# Patient Record
Sex: Male | Born: 1965 | Race: Black or African American | Hispanic: No | Smoking: Never smoker
Health system: Southern US, Community
[De-identification: ages and names within clinical notes are randomized; demographics above are authoritative.]

## PROBLEM LIST (undated history)

## (undated) DIAGNOSIS — I10 Essential (primary) hypertension: Secondary | ICD-10-CM

## (undated) DIAGNOSIS — Z86010 Personal history of colonic polyps: Secondary | ICD-10-CM

## (undated) DIAGNOSIS — E785 Hyperlipidemia, unspecified: Secondary | ICD-10-CM

## (undated) HISTORY — PX: COLONOSCOPY: SHX174

## (undated) HISTORY — PX: ROTATOR CUFF REPAIR: SHX139

## (undated) HISTORY — DX: Essential (primary) hypertension: I10

## (undated) HISTORY — DX: Personal history of colonic polyps: Z86.010

## (undated) HISTORY — DX: Hyperlipidemia, unspecified: E78.5

---

## 1997-09-22 ENCOUNTER — Ambulatory Visit (HOSPITAL_COMMUNITY): Admission: RE | Admit: 1997-09-22 | Discharge: 1997-09-22 | Payer: Self-pay | Admitting: Family Medicine

## 1999-08-25 ENCOUNTER — Emergency Department (HOSPITAL_COMMUNITY): Admission: EM | Admit: 1999-08-25 | Discharge: 1999-08-25 | Payer: Self-pay | Admitting: Emergency Medicine

## 1999-08-25 ENCOUNTER — Encounter: Payer: Self-pay | Admitting: Emergency Medicine

## 2000-05-22 ENCOUNTER — Encounter (INDEPENDENT_AMBULATORY_CARE_PROVIDER_SITE_OTHER): Payer: Self-pay | Admitting: Specialist

## 2000-05-22 ENCOUNTER — Ambulatory Visit (HOSPITAL_COMMUNITY): Admission: RE | Admit: 2000-05-22 | Discharge: 2000-05-22 | Payer: Self-pay | Admitting: General Surgery

## 2001-12-06 ENCOUNTER — Emergency Department (HOSPITAL_COMMUNITY): Admission: EM | Admit: 2001-12-06 | Discharge: 2001-12-06 | Payer: Self-pay | Admitting: Emergency Medicine

## 2001-12-15 ENCOUNTER — Ambulatory Visit (HOSPITAL_COMMUNITY): Admission: RE | Admit: 2001-12-15 | Discharge: 2001-12-15 | Payer: Self-pay | Admitting: Neurology

## 2002-02-25 ENCOUNTER — Emergency Department (HOSPITAL_COMMUNITY): Admission: EM | Admit: 2002-02-25 | Discharge: 2002-02-25 | Payer: Self-pay | Admitting: Emergency Medicine

## 2004-04-04 ENCOUNTER — Ambulatory Visit (HOSPITAL_COMMUNITY): Admission: RE | Admit: 2004-04-04 | Discharge: 2004-04-04 | Payer: Self-pay | Admitting: Internal Medicine

## 2004-06-09 ENCOUNTER — Emergency Department (HOSPITAL_COMMUNITY): Admission: EM | Admit: 2004-06-09 | Discharge: 2004-06-09 | Payer: Self-pay | Admitting: Emergency Medicine

## 2005-05-28 ENCOUNTER — Emergency Department (HOSPITAL_COMMUNITY): Admission: EM | Admit: 2005-05-28 | Discharge: 2005-05-28 | Payer: Self-pay | Admitting: Emergency Medicine

## 2005-07-23 ENCOUNTER — Ambulatory Visit (HOSPITAL_COMMUNITY): Admission: RE | Admit: 2005-07-23 | Discharge: 2005-07-23 | Payer: Self-pay | Admitting: Internal Medicine

## 2005-07-31 ENCOUNTER — Ambulatory Visit: Payer: Self-pay | Admitting: Internal Medicine

## 2005-09-04 ENCOUNTER — Ambulatory Visit: Payer: Self-pay | Admitting: Internal Medicine

## 2006-10-06 ENCOUNTER — Emergency Department (HOSPITAL_COMMUNITY): Admission: EM | Admit: 2006-10-06 | Discharge: 2006-10-06 | Payer: Self-pay | Admitting: Emergency Medicine

## 2007-07-14 ENCOUNTER — Encounter (INDEPENDENT_AMBULATORY_CARE_PROVIDER_SITE_OTHER): Payer: Self-pay | Admitting: Internal Medicine

## 2007-07-14 ENCOUNTER — Ambulatory Visit: Admission: RE | Admit: 2007-07-14 | Discharge: 2007-07-14 | Payer: Self-pay | Admitting: Internal Medicine

## 2007-07-14 ENCOUNTER — Ambulatory Visit: Payer: Self-pay | Admitting: Vascular Surgery

## 2008-12-09 ENCOUNTER — Encounter: Admission: RE | Admit: 2008-12-09 | Discharge: 2008-12-09 | Payer: Self-pay | Admitting: Internal Medicine

## 2009-10-02 ENCOUNTER — Ambulatory Visit (HOSPITAL_BASED_OUTPATIENT_CLINIC_OR_DEPARTMENT_OTHER): Admission: RE | Admit: 2009-10-02 | Discharge: 2009-10-02 | Payer: Self-pay | Admitting: Orthopaedic Surgery

## 2010-04-08 ENCOUNTER — Encounter: Payer: Self-pay | Admitting: Cardiology

## 2010-04-10 DIAGNOSIS — T7840XA Allergy, unspecified, initial encounter: Secondary | ICD-10-CM | POA: Insufficient documentation

## 2010-04-10 DIAGNOSIS — R079 Chest pain, unspecified: Secondary | ICD-10-CM | POA: Insufficient documentation

## 2010-04-10 DIAGNOSIS — E785 Hyperlipidemia, unspecified: Secondary | ICD-10-CM | POA: Insufficient documentation

## 2010-04-10 DIAGNOSIS — E782 Mixed hyperlipidemia: Secondary | ICD-10-CM | POA: Insufficient documentation

## 2010-04-10 DIAGNOSIS — I10 Essential (primary) hypertension: Secondary | ICD-10-CM | POA: Insufficient documentation

## 2010-04-11 ENCOUNTER — Institutional Professional Consult (permissible substitution) (INDEPENDENT_AMBULATORY_CARE_PROVIDER_SITE_OTHER): Payer: 59 | Admitting: Cardiology

## 2010-04-11 ENCOUNTER — Encounter: Payer: Self-pay | Admitting: Cardiology

## 2010-04-11 DIAGNOSIS — R072 Precordial pain: Secondary | ICD-10-CM

## 2010-04-18 NOTE — Assessment & Plan Note (Signed)
Summary: [facility]   History of Present Illness: 45 year old male for evaluation of chest pain. No prior cardiac history. Patient has some dyspnea with more extreme activities but not with routine activities. There is no orthopnea, PND, pedal edema, palpitations, syncope or exertional chest pain. Approximately 5 days ago while singing in church he felt a gripping sensation in his left chest for 3 minutes. It did not radiate. It was not pleuritic or positional. There were no associated symptoms. It resolved spontaneously. He had a second episode shortly thereafter. He's had no chest pain since then. Because of the above we were asked to further evaluate.  Preventive Screening-Counseling & Management  Alcohol-Tobacco     Smoking Status: never  Current Medications (verified): 1)  Crestor 10 Mg Tabs (Rosuvastatin Calcium) .... Take One Tablet By Mouth Daily. 2)  Tribenzor 40-10-25 Mg Tabs (Olmesartan-Amlodipine-Hctz) .... Once Daily  Allergies (verified): No Known Drug Allergies  Past History:  Past Medical History: HYPERLIPIDEMIA  HYPERTENSION  Past Surgical History: Right shoulder arthroscopic Surgery for fractured mandible     Family History: Reviewed history and no changes required. Hypertension No premature CAD  Social History: Reviewed history and no changes required. Full Time Married  Tobacco Use - No.  Alcohol Use - no Smoking Status:  never  Review of Systems       no fevers or chills, productive cough, hemoptysis, dysphasia, odynophagia, melena, hematochezia, dysuria, hematuria, rash, seizure activity, orthopnea, PND, pedal edema, claudication. Remaining systems are negative.   Vital Signs:  Patient profile:   46 year old male Height:      77 inches Weight:      313 pounds BMI:     37.25 Pulse rate:   89 / minute BP sitting:   122 / 80  (right arm)  Vitals Entered By: Laurance Flatten CMA (April 11, 2010 1:20 PM)  Physical Exam  General:  Well  developed/well nourished in NAD Skin warm/dry Patient not depressed No peripheral clubbing Back-normal HEENT-normal/normal eyelids Neck supple/normal carotid upstroke bilaterally; no bruits; no JVD; no thyromegaly chest - CTA/ normal expansion CV - RRR/normal S1 and S2; no murmurs, rubs or gallops;  PMI nondisplaced Abdomen -NT/ND, no HSM, no mass, + bowel sounds, no bruit 2+ femoral pulses, no bruits Ext-no edema, chords, 2+ DP Neuro-grossly nonfocal     EKG  Procedure date:  04/11/2010  Findings:      Normal sinus rhythm at a rate of 89. No ST changes.  Impression & Recommendations:  Problem # 1:  CHEST PAIN (ICD-786.50) Symptoms atypical. Schedule exercise treadmill. If negative no further workup.   Orders: Treadmill (Treadmill)  Problem # 2:  HYPERLIPIDEMIA (ICD-272.4) Continue present medications. Management per primary care. His updated medication list for this problem includes:    Crestor 10 Mg Tabs (Rosuvastatin calcium) .Marland Kitchen... Take one tablet by mouth daily.  Problem # 3:  HYPERTENSION (ICD-401.9) Blood pressure controlled on present medications. Will continue. His updated medication list for this problem includes:    Tribenzor 40-10-25 Mg Tabs (Olmesartan-amlodipine-hctz) ..... Once daily  Patient Instructions: 1)  Your physician has requested that you have an exercise tolerance test.  For further information please visit [email or web address]  Please also follow instruction sheet, as given.

## 2010-04-24 ENCOUNTER — Encounter: Payer: Self-pay | Admitting: Physician Assistant

## 2010-04-24 ENCOUNTER — Encounter (INDEPENDENT_AMBULATORY_CARE_PROVIDER_SITE_OTHER): Payer: 59 | Admitting: Physician Assistant

## 2010-04-24 DIAGNOSIS — R079 Chest pain, unspecified: Secondary | ICD-10-CM

## 2010-04-26 LAB — BASIC METABOLIC PANEL
BUN: 14 mg/dL (ref 6–23)
CO2: 29 mEq/L (ref 19–32)
Calcium: 8.9 mg/dL (ref 8.4–10.5)
Chloride: 108 mEq/L (ref 96–112)
Creatinine, Ser: 1.22 mg/dL (ref 0.4–1.5)
GFR calc Af Amer: >60 mL/min (ref >60)
GFR calc non Af Amer: >60 mL/min (ref >60)
Glucose, Bld: 118 mg/dL — ABNORMAL HIGH (ref 70–99)
Potassium: 3.8 mEq/L (ref 3.5–5.1)
Sodium: 141 mEq/L (ref 135–145)

## 2010-04-26 LAB — POCT HEMOGLOBIN-HEMACUE: Hemoglobin: 16.6 g/dL (ref 13.0–17.0)

## 2010-05-09 NOTE — Letter (Signed)
Summary: [facility] Office Visit Note   [facility] Office Visit Note   Imported By: Roderic Ovens 05/01/2010 14:36:38  _____________________________________________________________________  External Attachment:    Type:   Image     Comment:   External Document

## 2010-05-21 ENCOUNTER — Ambulatory Visit (HOSPITAL_COMMUNITY)
Admission: RE | Admit: 2010-05-21 | Discharge: 2010-05-21 | Disposition: A | Discharge status: Home / Self Care | Payer: Self-pay | Source: Ambulatory Visit | Attending: Internal Medicine | Admitting: Internal Medicine

## 2010-05-21 ENCOUNTER — Other Ambulatory Visit (HOSPITAL_COMMUNITY): Payer: Self-pay | Admitting: Internal Medicine

## 2010-05-21 DIAGNOSIS — M2010 Hallux valgus (acquired), unspecified foot: Secondary | ICD-10-CM | POA: Insufficient documentation

## 2010-05-21 DIAGNOSIS — R209 Unspecified disturbances of skin sensation: Secondary | ICD-10-CM | POA: Insufficient documentation

## 2010-05-21 DIAGNOSIS — M79609 Pain in unspecified limb: Secondary | ICD-10-CM

## 2010-06-28 NOTE — Op Note (Signed)
[facility]  Patient:    DARVELL, MONTEFORTE                MRN: [id number] Proc. Date: 05/22/00 Adm. Date:  [id number] Attending:  Carson Myrtle                           Operative Report  PREOPERATIVE DIAGNOSIS:  Fistula-in-ano.  POSTOPERATIVE DIAGNOSIS:  Fistula-in-ano.  PROCEDURE:  Repair of fistula.  SURGEON:  Kendrick Ranch, M.D.  ANESTHESIA:  General.  HISTORY OF PRESENT ILLNESS:  Mr. 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 The surgery has been carefully explained, prep given.  DESCRIPTION OF PROCEDURE:  The patient was brought to the operating room, placed supine, general LMA anesthesia provided. The patient was placed in lithotomy, perianal inspected, prepped and draped in the usual fashion. 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 The anus was gently dilated, the operating anoscope introduced, a fistula probe passed through the fistula tract. It was indeed superficial, it spared the sphincters. It was excised and debrided. Its edges and floor cauterized. Sphincters left intact of course. No other pathology noted, bleeding controlled. Gelfoam gauze and dry sterile dressing applied. He tolerated it well and was removed to the recovery room in good condition.  Written and verbal instructions were given to the patient and his girlfriend including Percocet #[id number] and he will be seen in the office. DD:  05/22/00 TD:  05/22/00 Job: [id number]

## 2010-07-15 ENCOUNTER — Telehealth: Payer: Self-pay | Admitting: Cardiology

## 2010-07-15 NOTE — Telephone Encounter (Signed)
Stress faxed to [facility] @ [phone]  07/15/10/km

## 2010-10-29 ENCOUNTER — Encounter (HOSPITAL_COMMUNITY): Payer: Self-pay

## 2010-10-29 ENCOUNTER — Emergency Department (HOSPITAL_COMMUNITY): Payer: Self-pay

## 2010-10-29 ENCOUNTER — Emergency Department (HOSPITAL_COMMUNITY)
Admission: EM | Admit: 2010-10-29 | Discharge: 2010-10-29 | Disposition: A | Discharge status: Home / Self Care | Payer: Self-pay | Attending: Emergency Medicine | Admitting: Emergency Medicine

## 2010-10-29 DIAGNOSIS — IMO0002 Reserved for concepts with insufficient information to code with codable children: Secondary | ICD-10-CM | POA: Insufficient documentation

## 2010-10-29 DIAGNOSIS — E785 Hyperlipidemia, unspecified: Secondary | ICD-10-CM | POA: Insufficient documentation

## 2010-10-29 DIAGNOSIS — I10 Essential (primary) hypertension: Secondary | ICD-10-CM | POA: Insufficient documentation

## 2010-10-29 DIAGNOSIS — Y92838 Other recreation area as the place of occurrence of the external cause: Secondary | ICD-10-CM | POA: Insufficient documentation

## 2010-10-29 DIAGNOSIS — Y9367 Activity, basketball: Secondary | ICD-10-CM | POA: Insufficient documentation

## 2010-10-29 DIAGNOSIS — Y9239 Other specified sports and athletic area as the place of occurrence of the external cause: Secondary | ICD-10-CM | POA: Insufficient documentation

## 2010-10-29 DIAGNOSIS — W219XXA Striking against or struck by unspecified sports equipment, initial encounter: Secondary | ICD-10-CM | POA: Insufficient documentation

## 2010-10-29 DIAGNOSIS — M1710 Unilateral primary osteoarthritis, unspecified knee: Secondary | ICD-10-CM | POA: Insufficient documentation

## 2010-11-27 ENCOUNTER — Other Ambulatory Visit: Payer: Self-pay | Admitting: Internal Medicine

## 2010-11-27 DIAGNOSIS — M25561 Pain in right knee: Secondary | ICD-10-CM

## 2010-12-03 ENCOUNTER — Ambulatory Visit
Admission: RE | Admit: 2010-12-03 | Discharge: 2010-12-03 | Disposition: A | Discharge status: Home / Self Care | Payer: PRIVATE HEALTH INSURANCE | Source: Ambulatory Visit | Attending: Internal Medicine | Admitting: Internal Medicine

## 2010-12-03 DIAGNOSIS — M25561 Pain in right knee: Secondary | ICD-10-CM

## 2011-09-07 IMAGING — CR DG FOOT COMPLETE 3+V*R*
3 series · 3 of 3 positions shown · non-contrast
Comparison: None.

CLINICAL DATA: Pain and numbness medially

RIGHT FOOT COMPLETE - 3+ VIEW

[view not recorded (1 of 3)]
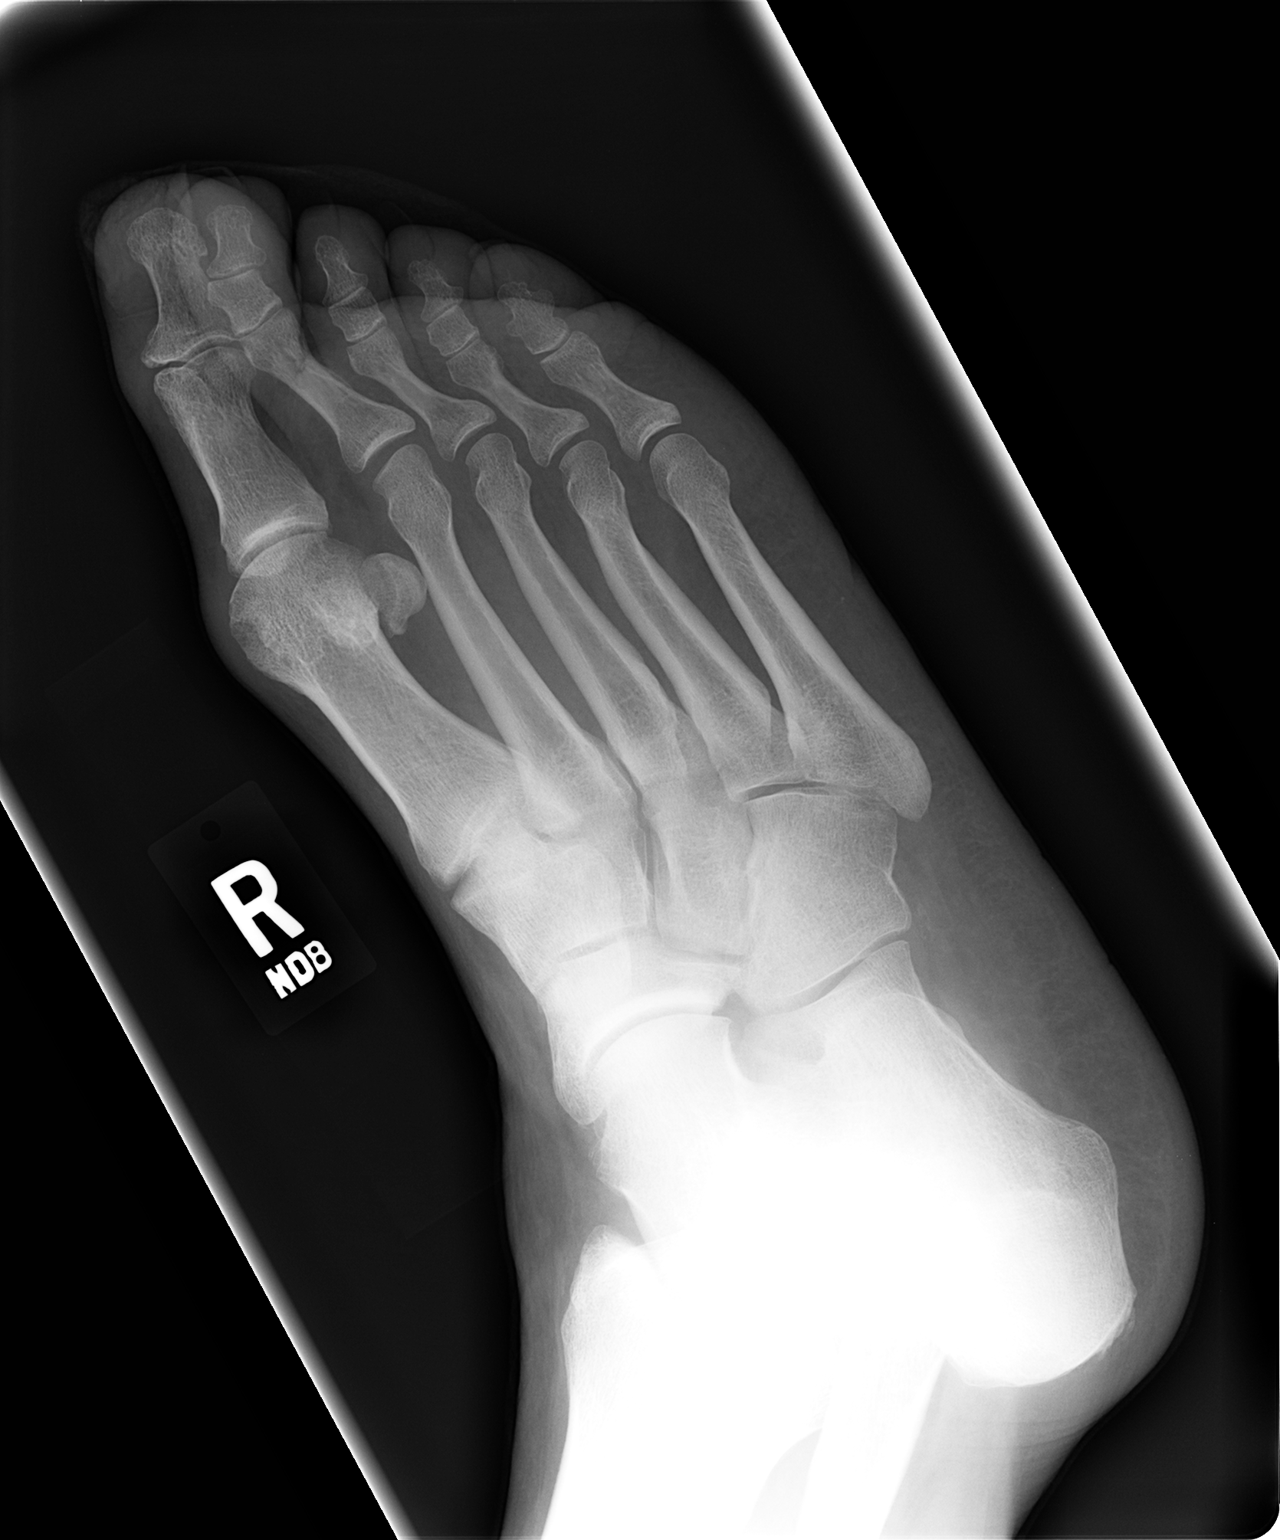

[view not recorded (2 of 3)]
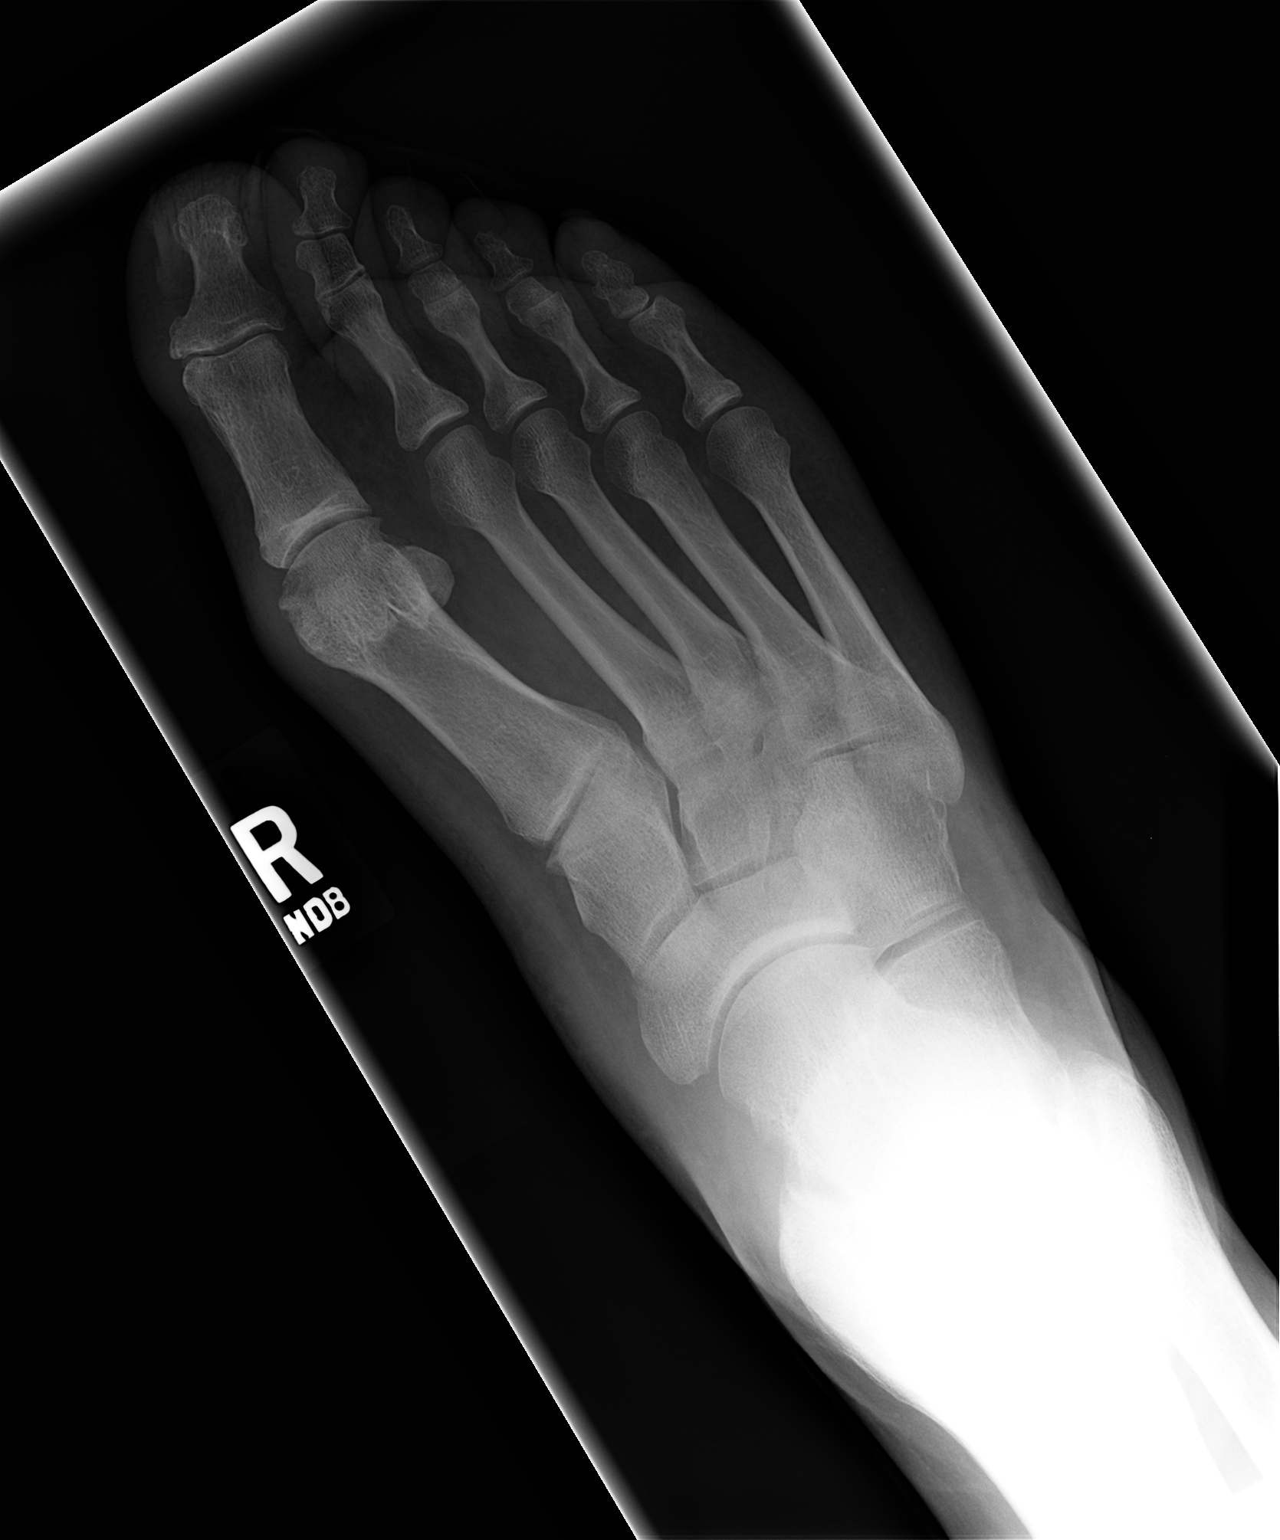

[view not recorded (3 of 3)]
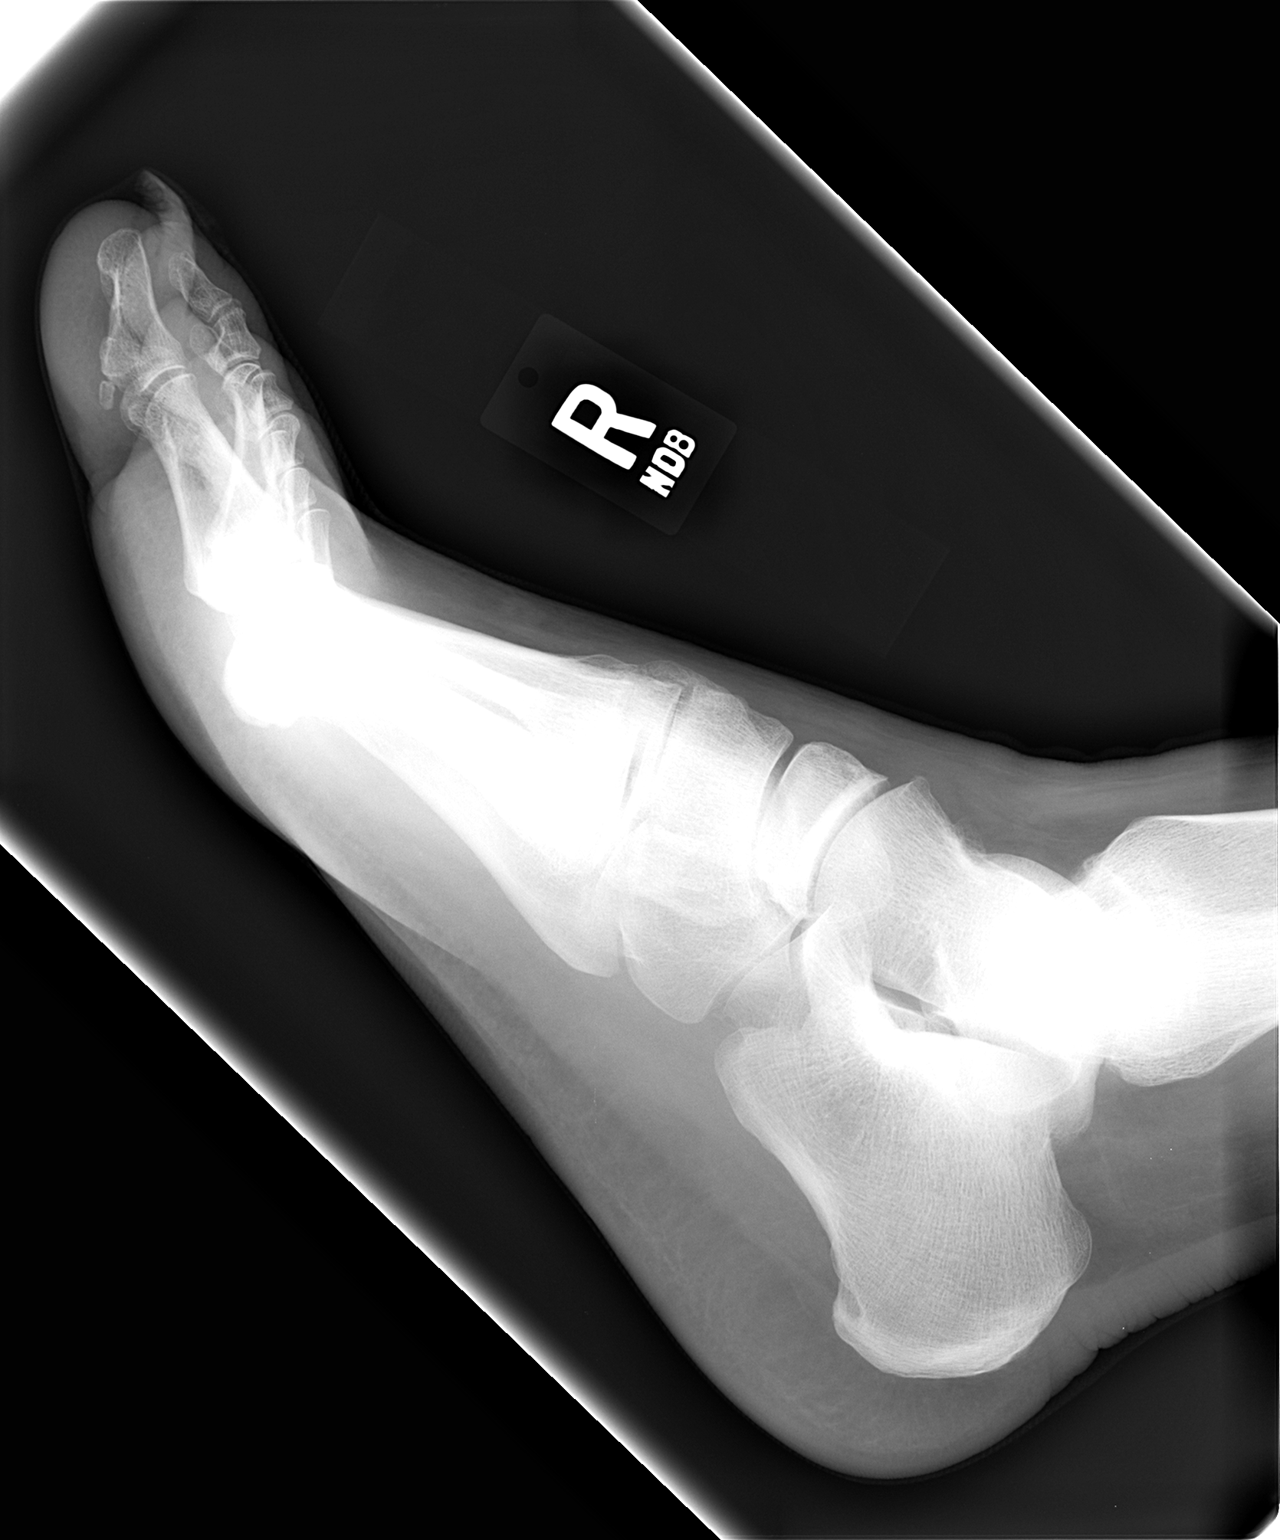

[3 of 3 positions shown; findings below may reference images not displayed]

FINDINGS: There is mild hallus valgus deformity  and subchondral
sclerosis at the first MTP joint.  There is no fracture or
dislocation.  No periosteal reaction or cortical destruction are
identified.
IMPRESSION: Mild hallux valgus deformity and subchondral sclerosis at the first
MTP joint.

## 2012-08-27 ENCOUNTER — Emergency Department (HOSPITAL_COMMUNITY)
Admission: EM | Admit: 2012-08-27 | Discharge: 2012-08-27 | Disposition: A | Discharge status: Home / Self Care | Payer: Self-pay | Attending: Emergency Medicine | Admitting: Emergency Medicine

## 2012-08-27 ENCOUNTER — Encounter (HOSPITAL_COMMUNITY): Payer: Self-pay | Admitting: Emergency Medicine

## 2012-08-27 DIAGNOSIS — M25519 Pain in unspecified shoulder: Secondary | ICD-10-CM | POA: Insufficient documentation

## 2012-08-27 DIAGNOSIS — R209 Unspecified disturbances of skin sensation: Secondary | ICD-10-CM | POA: Insufficient documentation

## 2012-08-27 DIAGNOSIS — M5412 Radiculopathy, cervical region: Secondary | ICD-10-CM | POA: Insufficient documentation

## 2012-08-27 MED ORDER — HYDROCODONE-ACETAMINOPHEN 5-325 MG PO TABS: 1.0000 | ORAL_TABLET | ORAL | Status: DC | PRN

## 2012-08-27 MED ORDER — METHOCARBAMOL 500 MG PO TABS: 500.0000 mg | ORAL_TABLET | Freq: Two times a day (BID) | ORAL | Status: DC

## 2012-08-27 MED ORDER — PREDNISONE 10 MG PO TABS: 10.0000 mg | ORAL_TABLET | Freq: Every day | ORAL | Status: DC

## 2012-08-27 NOTE — ED Notes (Addendum)
C/o L upper back pain and tingling down L arm x 1 1/2 weeks that is worse with movement.  Pain worse when trying to lay on back and sleep.  Denies chest pain.  Denies sob. No known injury.

## 2012-08-27 NOTE — ED Provider Notes (Signed)
   History    CSN: [id number] Arrival date & time 08/27/12  2126  First MD Initiated Contact with Patient 08/27/12 2211     Chief Complaint  Patient presents with  . Back Pain   HPI  History provided by the patient. The patient is a 47 year old male with previous history of rotator cuff repair presenting with complaints of pain in his left back and shoulder area radiating down his left arm to fingers. Symptoms have been persistent and slightly worsening for the past 2 weeks. Over the past few days patient has had increased tingling to his fingertips primarily his index and middle finger. His symptoms are worse when he moves his head and neck certain positions. Patient has taken Tylenol throughout the day without any significant change in his symptoms. He has not had any new injuries or trauma. Denies any repetitive activities or strenuous activity. He denies any other aggravating or alleviating factors. Denies any other associated symptoms. Denies any chest pain, palpitations shortness of breath. Denies any headache. No fever, chills or sweats.    History reviewed. No pertinent past medical history. Past Surgical History  Procedure Laterality Date  . Rotator cuff repair     No family history on file. History  Substance Use Topics  . Smoking status: Never Smoker   . Smokeless tobacco: Not on file  . Alcohol Use: No    Review of Systems  Constitutional: Negative for fever, chills, diaphoresis and unexpected weight change.  HENT: Positive for neck pain.   Respiratory: Negative for shortness of breath.   Cardiovascular: Negative for chest pain.  Musculoskeletal: Negative for back pain.  All other systems reviewed and are negative.    Allergies  Review of patient's allergies indicates no known allergies.  Home Medications  No current outpatient prescriptions on file. BP 133/73  Pulse 68  Temp(Src) 99 F (37.2 C) (Oral)  Resp 20  SpO2 100% Physical Exam  Nursing note and  vitals reviewed. Constitutional: He is oriented to person, place, and time. He appears well-developed and well-nourished.  HENT:  Head: Normocephalic.  Neck: Normal range of motion. Neck supple. Muscular tenderness present. No spinous process tenderness present.    Tenderness along the left trapezius area. No deformities. No mass. No spasm. No significant tenderness over the cervical spine. No deformity.  Cardiovascular: Normal rate and regular rhythm.   Normal distal pulses  Pulmonary/Chest: Effort normal and breath sounds normal. No stridor. No respiratory distress. He has no wheezes. He has no rales.  Abdominal: Soft.  Musculoskeletal: Normal range of motion. He exhibits no edema.  Mild tenderness around the deltoid area. No deformity or swelling. Negative Tinel's and Phalen's test  Neurological: He is alert and oriented to person, place, and time.  Strength is equal bilaterally in upper extremities. Patient reports no smoking numbness at this time with normal sensations to light and sharp touch the fingers.  Skin: Skin is warm.  Psychiatric: He has a normal mood and affect. His behavior is normal.    ED Course  Procedures     1. Cervical radicular pain     MDM  Patient seen and evaluated. Patient appears well no acute distress. No concerning or red flag symptoms for pain and tingling.  Symptoms seem consistent with the radicular or peripheral nerve pain. At this time will treat conservatively and have patient followup with PCP for further recommendations of testing. Patient agrees with plan.  Angus Seller, PA-C 08/28/12 [phone]

## 2012-08-28 NOTE — ED Provider Notes (Signed)
Medical screening examination/treatment/procedure(s) were performed by non-physician practitioner and as supervising physician I was immediately available for consultation/collaboration.    Tikia Skilton L Reily Ilic, MD 08/28/12 1520 

## 2012-09-02 ENCOUNTER — Emergency Department (HOSPITAL_COMMUNITY)
Admission: EM | Admit: 2012-09-02 | Discharge: 2012-09-02 | Disposition: A | Discharge status: Home / Self Care | Payer: Self-pay | Attending: Emergency Medicine | Admitting: Emergency Medicine

## 2012-09-02 ENCOUNTER — Emergency Department (HOSPITAL_COMMUNITY): Payer: Self-pay

## 2012-09-02 ENCOUNTER — Encounter (HOSPITAL_COMMUNITY): Payer: Self-pay | Admitting: *Deleted

## 2012-09-02 DIAGNOSIS — M5412 Radiculopathy, cervical region: Secondary | ICD-10-CM | POA: Insufficient documentation

## 2012-09-02 DIAGNOSIS — M25519 Pain in unspecified shoulder: Secondary | ICD-10-CM | POA: Insufficient documentation

## 2012-09-02 DIAGNOSIS — R209 Unspecified disturbances of skin sensation: Secondary | ICD-10-CM | POA: Insufficient documentation

## 2012-09-02 MED ORDER — HYDROMORPHONE HCL PF 1 MG/ML IJ SOLN
1.0000 mg | Freq: Once | INTRAMUSCULAR | Status: AC
  Administered 2012-09-02: 1 mg via INTRAMUSCULAR
  Filled 2012-09-02: qty 1

## 2012-09-02 MED ORDER — ONDANSETRON 4 MG PO TBDP
4.0000 mg | ORAL_TABLET | Freq: Once | ORAL | Status: AC
  Administered 2012-09-02: 4 mg via ORAL
  Filled 2012-09-02: qty 1

## 2012-09-02 MED ORDER — MORPHINE SULFATE 4 MG/ML IJ SOLN
4.0000 mg | Freq: Once | INTRAMUSCULAR | Status: AC
  Administered 2012-09-02: 4 mg via INTRAVENOUS

## 2012-09-02 MED ORDER — MORPHINE SULFATE 4 MG/ML IJ SOLN
INTRAMUSCULAR | Status: AC
  Filled 2012-09-02: qty 1

## 2012-09-02 NOTE — ED Provider Notes (Signed)
History    CSN: [id number] Arrival date & time 09/02/12  1101  First MD Initiated Contact with Patient 09/02/12 1125     No chief complaint on file.  (Consider location/radiation/quality/duration/timing/severity/associated sxs/prior Treatment) HPI Pt presenting with c/o left sided shoulder and neck pain.  Pt has had similar symptoms over the past 2 weeks.  He states he also has some numbness and tingling in his 2nd and 3rd fingers on the left hand intermittently.  No fever, no weakness of arm or hand.  He has had no injury.  Pain began when awakening from sleep one morning.  He has been taking motrin and prednisone (was seen in ED approx 1 week ago for similar symptoms)- he states he could not afford to get pain meds and muscle relaxer filled.  He does not feel the meds he is taking are helping him very much.  No chest pain or shortness of breath.  There are no other associated systemic symptoms, there are no other alleviating or modifying factors.  History reviewed. No pertinent past medical history. Past Surgical History  Procedure Laterality Date  . Rotator cuff repair     No family history on file. History  Substance Use Topics  . Smoking status: Never Smoker   . Smokeless tobacco: Not on file  . Alcohol Use: No    Review of Systems ROS reviewed and all otherwise negative except for mentioned in HPI  Allergies  Review of patient's allergies indicates no known allergies.  Home Medications   Current Outpatient Rx  Name  Route  Sig  Dispense  Refill  . ibuprofen (ADVIL,MOTRIN) 200 MG tablet   Oral   Take 400 mg by mouth every 6 (six) hours as needed for pain.         Marland Kitchen ibuprofen (ADVIL,MOTRIN) 800 MG tablet   Oral   Take 800 mg by mouth every 8 (eight) hours as needed for pain.         . Menthol, Topical Analgesic, (ICY HOT) 16 % LIQD   Apply externally   Apply 1 application topically as needed (pain).         . predniSONE (DELTASONE) 10 MG tablet   Oral  Take 1 tablet (10 mg total) by mouth daily. Take 6 tabs on day 1, 5 tabs on day 2, 4 tabs on day 3, 3 tabs on day 4, 2 tabs on day 5, and 1 tab on day 6.   21 tablet   0    BP 148/99  Pulse 72  Temp(Src) 99.1 F (37.3 C) (Oral)  Resp 20  SpO2 99% Vitals reviewed Physical Exam Physical Examination: General appearance - alert, well appearing, and in no distress Mental status - alert, oriented to person, place, and time Eyes - no conjunctival injection, no scleral icterus Neck - supple, pain in left trapezoid region with palpation and with movement of neck, no midline cspine tenderness Chest - clear to auscultation, no wheezes, rales or rhonchi, symmetric air entry Heart - normal rate, regular rhythm, normal S1, S2, no murmurs, rubs, clicks or gallops Back exam - full range of motion, no midline tenderness of back, no CVA tenderness Neurological - alert, oriented, normal speech, strength 5/5 in extremities x 4, full grip strength in left hand, normal testing of median/radial/ulnar nerve function, sensation intact to light touch Musculoskeletal - no joint tenderness, deformity or swelling Extremities - peripheral pulses normal, no pedal edema, no clubbing or cyanosis Skin - normal coloration and turgor,  no rashes, brisk cap refill  ED Course  Procedures (including critical care time)    Date: 09/02/2012  Rate: 72  Rhythm: normal sinus rhythm  QRS Axis: normal  Intervals: normal  ST/T Wave abnormalities: normal  Conduction Disutrbances: none  Narrative Interpretation: unremarkable, no significant change from last ekg of 08/27/12     Labs Reviewed - No data to display Dg Cervical Spine 2-3 Views  09/02/2012   *RADIOLOGY REPORT*  Clinical Data: Left-sided neck pain  CERVICAL SPINE - 2-3 VIEW  Comparison: None.  Findings: Seven cervical segments are well visualized.  Vertebral body height is well-maintained.  The odontoid is within normal limits.  Mild osteophytic changes are seen  particularly at the C2-3 level and C5-6 level.  No soft tissue changes are noted. Postsurgical changes in the mandible are noted.  IMPRESSION: Mild degenerative change without acute abnormality.   Original Report Authenticated By: Alcide Clever, M.D.   1. Cervical radiculopathy     MDM  Pt presenting with c/o pain in left side of neck and upper shoulder with radiation to left arm/hand, intermittent tingling of fingers- neurologic exam normal.  Pt has been taking nsaids and steroids, pt treated with pain meds in the ED.  Advised taking pain meds and muscle relaxers as well.  He will need to arrange follow up with his PMD, also given information for neurosurgery.  Discharged with strict return precautions.  Pt agreeable with plan.  Ethelda Chick, MD 09/02/12 [phone]

## 2012-09-02 NOTE — ED Notes (Signed)
Pt reports sharp left sided shoulder pain, started 2 weeks ago. Numbness/tingling to left fingers. Pain has gotten progressively worse. Denies any injury. Diaphoresis noted. Pt denies n/v, sob or dizziness. Has tried taking motrin, has not experienced any relief

## 2012-09-02 NOTE — Progress Notes (Signed)
[facility] provided pt with [employer] and a list of primary care resources.

## 2013-05-06 ENCOUNTER — Encounter: Payer: Self-pay | Admitting: Physician Assistant

## 2013-05-20 ENCOUNTER — Emergency Department (HOSPITAL_COMMUNITY): Payer: BC Managed Care – PPO

## 2013-05-20 ENCOUNTER — Emergency Department (HOSPITAL_COMMUNITY)
Admission: EM | Admit: 2013-05-20 | Discharge: 2013-05-20 | Disposition: A | Discharge status: Home / Self Care | Payer: BC Managed Care – PPO | Attending: Emergency Medicine | Admitting: Emergency Medicine

## 2013-05-20 DIAGNOSIS — M25519 Pain in unspecified shoulder: Secondary | ICD-10-CM | POA: Insufficient documentation

## 2013-05-20 DIAGNOSIS — R079 Chest pain, unspecified: Secondary | ICD-10-CM | POA: Insufficient documentation

## 2013-05-20 DIAGNOSIS — I10 Essential (primary) hypertension: Secondary | ICD-10-CM | POA: Insufficient documentation

## 2013-05-20 DIAGNOSIS — Z9861 Coronary angioplasty status: Secondary | ICD-10-CM | POA: Insufficient documentation

## 2013-05-20 DIAGNOSIS — Z79899 Other long term (current) drug therapy: Secondary | ICD-10-CM | POA: Insufficient documentation

## 2013-05-20 LAB — CBC WITH DIFFERENTIAL/PLATELET
Basophils Absolute: 0 10*3/uL (ref 0.0–0.1)
Basophils Relative: 1 % (ref 0–1)
Eosinophils Absolute: 0.1 10*3/uL (ref 0.0–0.7)
Eosinophils Relative: 2 % (ref 0–5)
HCT: 44.1 % (ref 39.0–52.0)
Hemoglobin: 14.8 g/dL (ref 13.0–17.0)
Lymphocytes Relative: 42 % (ref 12–46)
Lymphs Abs: 1.6 10*3/uL (ref 0.7–4.0)
MCH: 30.2 pg (ref 26.0–34.0)
MCHC: 33.6 g/dL (ref 30.0–36.0)
MCV: 90 fL (ref 78.0–100.0)
Monocytes Absolute: 0.4 10*3/uL (ref 0.1–1.0)
Monocytes Relative: 11 % (ref 3–12)
Neutro Abs: 1.7 10*3/uL (ref 1.7–7.7)
Neutrophils Relative %: 45 % (ref 43–77)
Platelets: 166 10*3/uL (ref 150–400)
RBC: 4.9 MIL/uL (ref 4.22–5.81)
RDW: 13.4 % (ref 11.5–15.5)
WBC: 3.7 10*3/uL — ABNORMAL LOW (ref 4.0–10.5)

## 2013-05-20 LAB — I-STAT TROPONIN, ED
Troponin i, poc: 0 ng/mL (ref 0.00–0.08)
Troponin i, poc: 0 ng/mL (ref 0.00–0.08)

## 2013-05-20 LAB — BASIC METABOLIC PANEL
BUN: 14 mg/dL (ref 6–23)
CO2: 25 mEq/L (ref 19–32)
Calcium: 8.5 mg/dL (ref 8.4–10.5)
Chloride: 108 mEq/L (ref 96–112)
Creatinine, Ser: 1.24 mg/dL (ref 0.50–1.35)
GFR calc Af Amer: 77 mL/min — ABNORMAL LOW (ref >90)
GFR calc non Af Amer: 67 mL/min — ABNORMAL LOW (ref >90)
Glucose, Bld: 89 mg/dL (ref 70–99)
Potassium: 3.8 mEq/L (ref 3.7–5.3)
Sodium: 143 mEq/L (ref 137–147)

## 2013-05-20 MED ORDER — ASPIRIN 325 MG PO TABS
325.0000 mg | ORAL_TABLET | Freq: Once | ORAL | Status: AC
  Administered 2013-05-20: 325 mg via ORAL
  Filled 2013-05-20: qty 1

## 2013-05-20 MED ORDER — NITROGLYCERIN 0.4 MG SL SUBL
0.4000 mg | SUBLINGUAL_TABLET | SUBLINGUAL | Status: AC | PRN
  Administered 2013-05-20 (×3): 0.4 mg via SUBLINGUAL
  Filled 2013-05-20: qty 1

## 2013-05-20 NOTE — ED Notes (Signed)
IV insertion attempted x 2, another RN at bedside now to draw blood and insert IV

## 2013-05-20 NOTE — ED Provider Notes (Signed)
CSN: [id number]     Arrival date & time 05/20/13  1817 History   First MD Initiated Contact with Patient 05/20/13 1934     Chief Complaint  Patient presents with  . Chest Pain  . Shoulder Pain     (Consider location/radiation/quality/duration/timing/severity/associated sxs/prior Treatment) The history is provided by the patient.  Glenn Oliver is a 48 y.o. male hx of HTN, previous cardiac stent done at [facility] here with chest pain. Intermittent chest pain for the last several days. Worse with movement. Has constant pain today. Denies shortness of breath. Had cardiac stent 2 years ago with similar pain. He has similar pain a year ago and had a normal stress test. Recently changed BP meds to lisinopril and bystolic.    No past medical history on file. Past Surgical History  Procedure Laterality Date  . Rotator cuff repair     No family history on file. History  Substance Use Topics  . Smoking status: Never Smoker   . Smokeless tobacco: Not on file  . Alcohol Use: No    Review of Systems  Cardiovascular: Positive for chest pain.  All other systems reviewed and are negative.     Allergies  Review of patient's allergies indicates no known allergies.  Home Medications   Current Outpatient Rx  Name  Route  Sig  Dispense  Refill  . lisinopril (PRINIVIL,ZESTRIL) 10 MG tablet   Oral   Take 5 mg by mouth daily. Take 1/2 tablet         . nebivolol (BYSTOLIC) 10 MG tablet   Oral   Take 10 mg by mouth daily.          BP 125/86  Pulse 50  Temp(Src) 97.8 F (36.6 C) (Oral)  Resp 11  SpO2 100% Physical Exam  Nursing note and vitals reviewed. Constitutional: He is oriented to person, place, and time. He appears well-developed and well-nourished.  Slightly uncomfortable   HENT:  Head: Normocephalic.  Mouth/Throat: Oropharynx is clear and moist.  Eyes: Conjunctivae and EOM are normal. Pupils are equal, round, and reactive to light.  Neck: Normal range of motion.  Neck supple.  Cardiovascular: Normal rate, regular rhythm and normal heart sounds.   Pulmonary/Chest: Effort normal and breath sounds normal. No respiratory distress. He has no wheezes. He has no rales. He exhibits no tenderness.  Abdominal: Soft. Bowel sounds are normal. He exhibits no distension. There is no tenderness. There is no rebound and no guarding.  Musculoskeletal: Normal range of motion. He exhibits no edema and no tenderness.  Neurological: He is alert and oriented to person, place, and time. No cranial nerve deficit. Coordination normal.  Skin: Skin is warm and dry.  Psychiatric: He has a normal mood and affect. His behavior is normal. Judgment and thought content normal.    ED Course  Procedures (including critical care time) Labs Review Labs Reviewed  CBC WITH DIFFERENTIAL - Abnormal; Notable for the following:    WBC 3.7 (*)    All other components within normal limits  BASIC METABOLIC PANEL - Abnormal; Notable for the following:    GFR calc non Af Amer 67 (*)    GFR calc Af Amer 77 (*)    All other components within normal limits  I-STAT TROPOININ, ED  Randolm Idol, ED   Imaging Review Dg Chest 2 View  05/20/2013   CLINICAL DATA:  Chest and shoulder pain.  EXAM: CHEST  2 VIEW  COMPARISON:  DG CHEST 2 VIEW dated  07/23/2005  FINDINGS: Cardiomediastinal silhouette is unremarkable. The lungs are clear without pleural effusions or focal consolidations. Trachea projects midline and there is no pneumothorax. Soft tissue planes and included osseous structures are non-suspicious. Multiple EKG lines overlie the patient and may obscure subtle underlying pathology. Gas-filled nondistended small large bowel and included view of the abdomen.  IMPRESSION: No acute cardiopulmonary process.   Electronically Signed   By: Elon Alas   On: 05/20/2013 20:50     EKG Interpretation   Date/Time:  Friday May 20 2013 18:59:36 EDT Ventricular Rate:  72 PR Interval:  175 QRS  Duration: 87 QT Interval:  392 QTC Calculation: 429 R Axis:   20 Text Interpretation:  Sinus rhythm Abnormal R-wave progression, early  transition No significant change since last tracing Confirmed by YAO  MD,  DAVID ([id number]) on 05/20/2013 7:34:27 PM      MDM   Final diagnoses:  None    Glenn Oliver is a 48 y.o. male here with chest pain. Given history of stent, he is moderate risk for ACS. Will get trop x 2. Will reassess after asa, nitro.   11:29 PM Pain free. Trop neg x 2. Will d/c home. Recommend outpatient stress test.    Wandra Arthurs, MD 05/20/13 2329

## 2013-05-20 NOTE — Discharge Instructions (Signed)
Take Aspirin 81 mg daily.   Follow up with your doctor. You may need a stress test.   Return to ER if you have severe chest pain, shortness of breath.

## 2013-05-20 NOTE — ED Notes (Signed)
Pt states today she had intermit chest pain began this am while at rest states that he had recenlty been started on bp meds and they changed them a few weeks ago. Lt arm, lt shoulder and lt chest pain. Dry, intermit sob. States last yr had this same and they gave him 2 nitro for relief and has had a stress test done also.

## 2014-01-23 ENCOUNTER — Encounter: Payer: Self-pay | Admitting: Internal Medicine

## 2014-04-06 ENCOUNTER — Encounter: Payer: Self-pay | Admitting: Internal Medicine

## 2014-05-30 ENCOUNTER — Ambulatory Visit (AMBULATORY_SURGERY_CENTER): Payer: Self-pay | Admitting: *Deleted

## 2014-05-30 VITALS — Ht 76.0 in | Wt 281.4 lb

## 2014-05-30 DIAGNOSIS — Z1211 Encounter for screening for malignant neoplasm of colon: Secondary | ICD-10-CM

## 2014-06-12 DIAGNOSIS — Z86010 Personal history of colonic polyps: Secondary | ICD-10-CM

## 2014-06-12 DIAGNOSIS — Z860101 Personal history of adenomatous and serrated colon polyps: Secondary | ICD-10-CM

## 2014-06-12 HISTORY — DX: Personal history of colonic polyps: Z86.010

## 2014-06-12 HISTORY — DX: Personal history of adenomatous and serrated colon polyps: Z86.0101

## 2014-06-13 ENCOUNTER — Encounter: Payer: Self-pay | Admitting: Internal Medicine

## 2014-06-13 ENCOUNTER — Ambulatory Visit (AMBULATORY_SURGERY_CENTER): Payer: Managed Care, Other (non HMO) | Admitting: Internal Medicine

## 2014-06-13 VITALS — BP 111/79 | HR 63 | Temp 97.7°F | Resp 28 | Ht 76.0 in | Wt 281.0 lb

## 2014-06-13 DIAGNOSIS — K648 Other hemorrhoids: Secondary | ICD-10-CM

## 2014-06-13 DIAGNOSIS — Z1211 Encounter for screening for malignant neoplasm of colon: Secondary | ICD-10-CM | POA: Diagnosis present

## 2014-06-13 DIAGNOSIS — D120 Benign neoplasm of cecum: Secondary | ICD-10-CM

## 2014-06-13 MED ORDER — SODIUM CHLORIDE 0.9 % IV SOLN
500.0000 mL | INTRAVENOUS | Status: DC
Start: 2014-06-13 — End: 2014-06-15

## 2014-06-13 NOTE — Progress Notes (Signed)
A/ox3 pleased with MAC, report to Jane RN 

## 2014-06-13 NOTE — Op Note (Signed)
[place]  [address] [place], [id number]   COLONOSCOPY PROCEDURE REPORT  PATIENT: Glenn Oliver, Glenn Oliver  MR#: [id number] BIRTHDATE: 1965-03-21 , 48  yrs. old GENDER: male ENDOSCOPIST: Gatha Mayer, MD, [facility] PROCEDURE DATE:  06/13/2014 PROCEDURE:   Colonoscopy, screening and Colonoscopy with snare polypectomy First Screening Colonoscopy - Avg.  risk and is 50 yrs.  old or older Yes.  Prior Negative Screening - Now for repeat screening. N/A  History of Adenoma - Now for follow-up colonoscopy & has been > or = to 3 yrs.  N/A ASA CLASS:   Class II INDICATIONS:Screening for colonic neoplasia and Colorectal Neoplasm Risk Assessment for this procedure is average risk. MEDICATIONS: Propofol 350 mg IV and Monitored anesthesia care  DESCRIPTION OF PROCEDURE:   After the risks benefits and alternatives of the procedure were thoroughly explained, informed consent was obtained.  The digital rectal exam revealed no abnormalities of the rectum, revealed no prostatic nodules, and revealed the prostate was not enlarged.   The [id number] endoscope was introduced through the anus and advanced to the cecum, which was identified by both the appendix and ileocecal valve. No adverse events experienced.   The quality of the prep was adequate (MiraLax was used)  The instrument was then slowly withdrawn as the colon was fully examined.   COLON FINDINGS: Two sessile polyps ranging from 5 to 65mm in size were found at the cecum.  Polypectomies were performed with a cold snare.  The resection was complete, the polyp tissue was completely retrieved and sent to histology.   Internal hemorrhoids were found. The examination was otherwise normal.  Retroflexed views revealed internal hemorrhoids. The time to cecum = 7.5 Withdrawal time = 17.9   The scope was withdrawn and the procedure completed. COMPLICATIONS: There were no immediate complications.  ENDOSCOPIC IMPRESSION: 1.    Two sessile polyps ranging from 5 to 27mm in size were found at the cecum; polypectomies were performed with a cold snare 2.   Internal hemorrhoids 3.   The examination was otherwise normal - adequate prep  RECOMMENDATIONS: 1.  Timing of repeat colonoscopy will be determined by pathology findings. 2.  Hemorrhoid banding an option if hemorrhoids are symptomatic  eSigned:  Gatha Mayer, MD, [facility] 06/13/2014 12:34 PM   cc: Glendale Chard, MD and The Patient

## 2014-06-13 NOTE — Progress Notes (Signed)
Called to room to assist during endoscopic procedure.  Patient ID and intended procedure confirmed with present staff. Received instructions for my participation in the procedure from the performing physician.  

## 2014-06-13 NOTE — Patient Instructions (Addendum)
I found and removed 2 polyps that look benign.  You also have hemorrhoids.  I will let you know pathology results and when to have another routine colonoscopy by mail.  I appreciate the opportunity to care for you. Gatha Mayer, MD, FACG  YOU HAD AN ENDOSCOPIC PROCEDURE TODAY AT [facility]:   Refer to the procedure report that was given to you for any specific questions about what was found during the examination.  If the procedure report does not answer your questions, please call your gastroenterologist to clarify.  If you requested that your care partner not be given the details of your procedure findings, then the procedure report has been included in a sealed envelope for you to review at your convenience later.  YOU SHOULD EXPECT: Some feelings of bloating in the abdomen. Passage of more gas than usual.  Walking can help get rid of the air that was put into your GI tract during the procedure and reduce the bloating. If you had a lower endoscopy (such as a colonoscopy or flexible sigmoidoscopy) you may notice spotting of blood in your stool or on the toilet paper. If you underwent a bowel prep for your procedure, you may not have a normal bowel movement for a few days.  Please Note:  You might notice some irritation and congestion in your nose or some drainage.  This is from the oxygen used during your procedure.  There is no need for concern and it should clear up in a day or so.  SYMPTOMS TO REPORT IMMEDIATELY:   Following lower endoscopy (colonoscopy or flexible sigmoidoscopy):  Excessive amounts of blood in the stool  Significant tenderness or worsening of abdominal pains  Swelling of the abdomen that is new, acute  Fever of 100F or higher    For urgent or emergent issues, a gastroenterologist can be reached at any hour by calling [phone].   DIET: Your first meal following the procedure should be a small meal and then it is ok to progress to  your normal diet. Heavy or fried foods are harder to digest and may make you feel nauseous or bloated.  Likewise, meals heavy in dairy and vegetables can increase bloating.  Drink plenty of fluids but you should avoid alcoholic beverages for 24 hours.  ACTIVITY:  You should plan to take it easy for the rest of today and you should NOT DRIVE or use heavy machinery until tomorrow (because of the sedation medicines used during the test).    FOLLOW UP: Our staff will call the number listed on your records the next business day following your procedure to check on you and address any questions or concerns that you may have regarding the information given to you following your procedure. If we do not reach you, we will leave a message.  However, if you are feeling well and you are not experiencing any problems, there is no need to return our call.  We will assume that you have returned to your regular daily activities without incident.  If any biopsies were taken you will be contacted by phone or by letter within the next 1-3 weeks.  Please call us at [phone] if you have not heard about the biopsies in 3 weeks.    SIGNATURES/CONFIDENTIALITY: You and/or your care partner have signed paperwork which will be entered into your electronic medical record.  These signatures attest to the fact that that the information above on your After Visit  Summary has been reviewed and is understood.  Full responsibility of the confidentiality of this discharge information lies with you and/or your care-partner.  Polyp, hemorrhoid , and  Hemorrhoid banding information given.

## 2014-06-14 ENCOUNTER — Telehealth: Payer: Self-pay | Admitting: *Deleted

## 2014-06-14 NOTE — Telephone Encounter (Signed)
  No answer and no answering machine picked up to leave message

## 2014-06-20 ENCOUNTER — Encounter: Payer: Self-pay | Admitting: Internal Medicine

## 2014-06-20 DIAGNOSIS — Z86010 Personal history of colonic polyps: Secondary | ICD-10-CM

## 2014-06-20 NOTE — Progress Notes (Signed)
Quick Note:  2 adenomas max 10 mm Repeat colonoscopy 2019 ______

## 2014-08-26 ENCOUNTER — Encounter (HOSPITAL_COMMUNITY): Payer: Self-pay | Admitting: Emergency Medicine

## 2014-08-26 ENCOUNTER — Observation Stay (HOSPITAL_COMMUNITY)
Admission: EM | Admit: 2014-08-26 | Discharge: 2014-08-27 | Disposition: A | Discharge status: Home / Self Care | Payer: Managed Care, Other (non HMO) | Attending: Internal Medicine | Admitting: Internal Medicine

## 2014-08-26 ENCOUNTER — Emergency Department (HOSPITAL_COMMUNITY): Payer: Managed Care, Other (non HMO)

## 2014-08-26 DIAGNOSIS — R0789 Other chest pain: Secondary | ICD-10-CM | POA: Diagnosis not present

## 2014-08-26 DIAGNOSIS — I10 Essential (primary) hypertension: Secondary | ICD-10-CM | POA: Diagnosis not present

## 2014-08-26 DIAGNOSIS — R079 Chest pain, unspecified: Secondary | ICD-10-CM | POA: Diagnosis present

## 2014-08-26 DIAGNOSIS — Z79899 Other long term (current) drug therapy: Secondary | ICD-10-CM | POA: Diagnosis not present

## 2014-08-26 DIAGNOSIS — I2510 Atherosclerotic heart disease of native coronary artery without angina pectoris: Secondary | ICD-10-CM | POA: Insufficient documentation

## 2014-08-26 DIAGNOSIS — E785 Hyperlipidemia, unspecified: Secondary | ICD-10-CM | POA: Insufficient documentation

## 2014-08-26 DIAGNOSIS — E782 Mixed hyperlipidemia: Secondary | ICD-10-CM | POA: Diagnosis present

## 2014-08-26 DIAGNOSIS — R0602 Shortness of breath: Secondary | ICD-10-CM | POA: Insufficient documentation

## 2014-08-26 DIAGNOSIS — Z955 Presence of coronary angioplasty implant and graft: Secondary | ICD-10-CM | POA: Diagnosis not present

## 2014-08-26 LAB — CBC
HCT: 48.3 % (ref 39.0–52.0)
Hemoglobin: 15.7 g/dL (ref 13.0–17.0)
MCH: 30 pg (ref 26.0–34.0)
MCHC: 32.5 g/dL (ref 30.0–36.0)
MCV: 92.2 fL (ref 78.0–100.0)
Platelets: 170 10*3/uL (ref 150–400)
RBC: 5.24 MIL/uL (ref 4.22–5.81)
RDW: 13.7 % (ref 11.5–15.5)
WBC: 3.7 10*3/uL — ABNORMAL LOW (ref 4.0–10.5)

## 2014-08-26 LAB — BASIC METABOLIC PANEL
Anion gap: 5 (ref 5–15)
BUN: 15 mg/dL (ref 6–20)
CO2: 28 mmol/L (ref 22–32)
Calcium: 8.5 mg/dL — ABNORMAL LOW (ref 8.9–10.3)
Chloride: 109 mmol/L (ref 101–111)
Creatinine, Ser: 1.45 mg/dL — ABNORMAL HIGH (ref 0.61–1.24)
GFR calc Af Amer: >60 mL/min (ref >60)
GFR calc non Af Amer: 55 mL/min — ABNORMAL LOW (ref >60)
Glucose, Bld: 101 mg/dL — ABNORMAL HIGH (ref 65–99)
Potassium: 3.8 mmol/L (ref 3.5–5.1)
Sodium: 142 mmol/L (ref 135–145)

## 2014-08-26 LAB — TROPONIN I: Troponin I: <0.03 ng/mL (ref <0.031)

## 2014-08-26 MED ORDER — ONDANSETRON HCL 4 MG/2ML IJ SOLN
4.0000 mg | Freq: Once | INTRAMUSCULAR | Status: AC
  Administered 2014-08-26: 4 mg via INTRAVENOUS
  Filled 2014-08-26: qty 2

## 2014-08-26 MED ORDER — MORPHINE SULFATE 4 MG/ML IJ SOLN
4.0000 mg | Freq: Once | INTRAMUSCULAR | Status: AC
  Administered 2014-08-26: 4 mg via INTRAVENOUS
  Filled 2014-08-26: qty 1

## 2014-08-26 MED ORDER — NITROGLYCERIN 0.4 MG SL SUBL
0.4000 mg | SUBLINGUAL_TABLET | SUBLINGUAL | Status: AC | PRN
  Administered 2014-08-26 (×3): 0.4 mg via SUBLINGUAL
  Filled 2014-08-26: qty 1

## 2014-08-26 NOTE — ED Notes (Signed)
Pt reports that his mid chest pain has decreased to 6/10 and still feels like "pressure".  He now also has a nagging discomfort in his lower back and some tingling in his left arm.  PA notified.

## 2014-08-26 NOTE — ED Notes (Addendum)
Pt complaining of chest pain (pressure)/sob starting at 22:00 tonight. States he has been sweating more. Denies N/V. States this has only happened once before 1 year prior and he had had one stent placed.   Pt took 2 aspirins prior to arrival

## 2014-08-26 NOTE — ED Provider Notes (Signed)
CSN: [id number]     Arrival date & time 08/26/14  2216 History   First MD Initiated Contact with Patient 08/26/14 2222     Chief Complaint  Patient presents with  . Chest Pain  . Shortness of Breath     (Consider location/radiation/quality/duration/timing/severity/associated sxs/prior Treatment) HPI Comments: Patient is 49 year old male with a past medical history of hypertension and hyperlipidemia who presents with sudden onset of chest pain that started 20 minutes prior to arrival. Patient reports he was sitting on his couch and watching TV when he had sudden onset of central chest pressure that is severe without radiation. He reports associated diaphoresis and SOB. He continues to have chest pain. He took ASA at home. No aggravating/alleviating factors. Patient reports he had a stent placed when he was having chest pain about 1 year ago in [place].   Patient is a 49 y.o. male presenting with chest pain and shortness of breath.  Chest Pain Associated symptoms: diaphoresis and shortness of breath   Associated symptoms: no abdominal pain, no dizziness, no dysphagia, no fatigue, no fever, no nausea, no palpitations, not vomiting and no weakness   Shortness of Breath Associated symptoms: chest pain and diaphoresis   Associated symptoms: no abdominal pain, no fever, no neck pain and no vomiting     Past Medical History  Diagnosis Date  . Hypertension   . Hyperlipidemia     no meds   . Hx of adenomatous colonic polyps 06/12/2014   Past Surgical History  Procedure Laterality Date  . Rotator cuff repair      right  . Colonoscopy     Family History  Problem Relation Age of Onset  . Prostate cancer Maternal Uncle   . Colon cancer Neg Hx   . Esophageal cancer Neg Hx   . Rectal cancer Neg Hx   . Stomach cancer Neg Hx    History  Substance Use Topics  . Smoking status: Never Smoker   . Smokeless tobacco: Never Used  . Alcohol Use: No    Review of Systems  Constitutional: Positive  for diaphoresis. Negative for fever, chills and fatigue.  HENT: Negative for trouble swallowing.   Eyes: Negative for visual disturbance.  Respiratory: Positive for shortness of breath.   Cardiovascular: Positive for chest pain. Negative for palpitations.  Gastrointestinal: Negative for nausea, vomiting, abdominal pain and diarrhea.  Genitourinary: Negative for dysuria and difficulty urinating.  Musculoskeletal: Negative for arthralgias and neck pain.  Skin: Negative for color change.  Neurological: Negative for dizziness and weakness.  Psychiatric/Behavioral: Negative for dysphoric mood.      Allergies  Review of patient's allergies indicates no known allergies.  Home Medications   Prior to Admission medications   Medication Sig Start Date End Date Taking? Authorizing Provider  Cholecalciferol (VITAMIN D PO) Take by mouth. Takes twice weekly    Historical Provider, MD  lisinopril (PRINIVIL,ZESTRIL) 10 MG tablet Take 5 mg by mouth daily. Take 1/2 tablet    Historical Provider, MD   BP 158/93 mmHg  Pulse 66  Temp(Src) 98.1 F (36.7 C) (Oral)  Resp 16  SpO2 100% Physical Exam  Constitutional: He is oriented to person, place, and time. He appears well-developed and well-nourished. No distress.  Patient diaphoretic  HENT:  Head: Normocephalic and atraumatic.  Eyes: Conjunctivae and EOM are normal.  Neck: Normal range of motion.  Cardiovascular: Normal rate and regular rhythm.  Exam reveals no gallop and no friction rub.   No murmur heard.  Pulmonary/Chest: Effort normal and breath sounds normal. He has no wheezes. He has no rales. He exhibits no tenderness.  Abdominal: Soft. He exhibits no distension. There is no tenderness. There is no rebound.  Musculoskeletal: Normal range of motion.  Neurological: He is alert and oriented to person, place, and time. Coordination normal.  Speech is goal-oriented. Moves limbs without ataxia.   Skin: Skin is warm and dry.  Psychiatric: He  has a normal mood and affect. His behavior is normal.  Nursing note and vitals reviewed.   ED Course  Procedures (including critical care time) Labs Review Labs Reviewed  BASIC METABOLIC PANEL - Abnormal; Notable for the following:    Glucose, Bld 101 (*)    Creatinine, Ser 1.45 (*)    Calcium 8.5 (*)    GFR calc non Af Amer 55 (*)    All other components within normal limits  CBC - Abnormal; Notable for the following:    WBC 3.7 (*)    All other components within normal limits  TROPONIN I  MAGNESIUM  TROPONIN I  TROPONIN I  LIPID PANEL    Imaging Review Dg Chest 2 View  08/26/2014   CLINICAL DATA:  49 year old male with chest pain and shortness of breath  EXAM: CHEST  2 VIEW  COMPARISON:  Chest radiograph dated 05/20/2013  FINDINGS: The heart size and mediastinal contours are within normal limits. Both lungs are clear. The visualized skeletal structures are unremarkable.  IMPRESSION: No active cardiopulmonary disease.   Electronically Signed   By: Anner Crete M.D.   On: 08/26/2014 22:42     EKG Interpretation   Date/Time:  Saturday August 26 2014 23:32:17 EDT Ventricular Rate:  66 PR Interval:  172 QRS Duration: 88 QT Interval:  393 QTC Calculation: 412 R Axis:   26 Text Interpretation:  Sinus rhythm Abnormal R-wave progression, early  transition No significant change since last tracing Confirmed by Glynn Octave [phone]) on 08/26/2014 11:38:09 PM      MDM   Final diagnoses:  Chest pain, unspecified chest pain type  SOB (shortness of breath)    10:43 PM Patient's labs and chest xray pending. Patient took aspirin at home. He will have nitro here. Vitals stable and patient afebrile. Patient having current chest pain.   12:01 AM Patient continues to have chest pain. EKG, chest xray, and labs unremarkable for acute changes. Patient with a HEART score of 4. Will admit to cycle troponin.   Alvina Chou, PA-C 08/27/14 5093  Wandra Arthurs,  MD 08/27/14 [phone]

## 2014-08-26 NOTE — ED Notes (Signed)
Nurse drawing labs. 

## 2014-08-26 NOTE — ED Notes (Signed)
Pt reports continued SOB; placed on 2L oxygen via nasal cannula.  Saturation reads at 97% on 2L.

## 2014-08-26 NOTE — ED Notes (Signed)
Pt reports central chest pain starting about half an hour ago while watching tv at home.  His pain is 8/10 and described as "pressure" but does not radiate.  Pt denies nausea, vomiting, and dizziness.  Positive for SOB and diaphoresis.  Pt hx of stent placement for coronary artery blockage, performed in [place] about a year ago.  No known hx of MI.  No smoking history.  Pt reports that he has hypertension, high cholesterol, and strong family history of heart disease.

## 2014-08-27 ENCOUNTER — Encounter (HOSPITAL_COMMUNITY): Payer: Self-pay | Admitting: Internal Medicine

## 2014-08-27 DIAGNOSIS — R079 Chest pain, unspecified: Secondary | ICD-10-CM

## 2014-08-27 DIAGNOSIS — I10 Essential (primary) hypertension: Secondary | ICD-10-CM

## 2014-08-27 DIAGNOSIS — E785 Hyperlipidemia, unspecified: Secondary | ICD-10-CM

## 2014-08-27 LAB — MAGNESIUM: Magnesium: 2.1 mg/dL (ref 1.7–2.4)

## 2014-08-27 LAB — LIPID PANEL
Cholesterol: 157 mg/dL (ref 0–200)
HDL: 34 mg/dL — ABNORMAL LOW (ref >40)
LDL Cholesterol: 110 mg/dL — ABNORMAL HIGH (ref 0–99)
Total CHOL/HDL Ratio: 4.6 RATIO
Triglycerides: 64 mg/dL (ref <150)
VLDL: 13 mg/dL (ref 0–40)

## 2014-08-27 LAB — TROPONIN I
Troponin I: <0.03 ng/mL (ref <0.031)
Troponin I: <0.03 ng/mL (ref <0.031)

## 2014-08-27 MED ORDER — ENOXAPARIN SODIUM 40 MG/0.4ML ~~LOC~~ SOLN
40.0000 mg | SUBCUTANEOUS | Status: DC
  Filled 2014-08-27: qty 0.4

## 2014-08-27 MED ORDER — IBUPROFEN 600 MG PO TABS
600.0000 mg | ORAL_TABLET | Freq: Once | ORAL | Status: AC
  Administered 2014-08-27: 600 mg via ORAL
  Filled 2014-08-27: qty 1

## 2014-08-27 MED ORDER — ACETAMINOPHEN 325 MG PO TABS: 650.0000 mg | ORAL_TABLET | ORAL | Status: DC | PRN

## 2014-08-27 MED ORDER — ONDANSETRON HCL 4 MG/2ML IJ SOLN: 4.0000 mg | Freq: Four times a day (QID) | INTRAMUSCULAR | Status: DC | PRN

## 2014-08-27 MED ORDER — MORPHINE SULFATE 2 MG/ML IJ SOLN
2.0000 mg | INTRAMUSCULAR | Status: DC | PRN
  Administered 2014-08-27: 2 mg via INTRAVENOUS
  Filled 2014-08-27: qty 1

## 2014-08-27 MED ORDER — ALPRAZOLAM 0.25 MG PO TABS: 0.2500 mg | ORAL_TABLET | Freq: Two times a day (BID) | ORAL | Status: DC | PRN

## 2014-08-27 MED ORDER — IBUPROFEN 600 MG PO TABS: 600.0000 mg | ORAL_TABLET | Freq: Three times a day (TID) | ORAL | Status: DC | PRN

## 2014-08-27 MED ORDER — MORPHINE SULFATE 4 MG/ML IJ SOLN
4.0000 mg | Freq: Once | INTRAMUSCULAR | Status: AC
  Administered 2014-08-27: 4 mg via INTRAVENOUS
  Filled 2014-08-27: qty 1

## 2014-08-27 MED ORDER — ROSUVASTATIN CALCIUM 10 MG PO TABS
10.0000 mg | ORAL_TABLET | Freq: Every day | ORAL | Status: DC
  Administered 2014-08-27: 10 mg via ORAL
  Filled 2014-08-27: qty 1

## 2014-08-27 MED ORDER — ASPIRIN 325 MG PO TBEC: 325.0000 mg | DELAYED_RELEASE_TABLET | Freq: Every day | ORAL | Status: DC

## 2014-08-27 MED ORDER — ASPIRIN EC 325 MG PO TBEC
325.0000 mg | DELAYED_RELEASE_TABLET | Freq: Every day | ORAL | Status: DC
  Administered 2014-08-27: 325 mg via ORAL
  Filled 2014-08-27: qty 1

## 2014-08-27 MED ORDER — LISINOPRIL 10 MG PO TABS
10.0000 mg | ORAL_TABLET | Freq: Every day | ORAL | Status: DC
  Administered 2014-08-27: 10 mg via ORAL
  Filled 2014-08-27: qty 1

## 2014-08-27 MED ORDER — SODIUM CHLORIDE 0.9 % IJ SOLN
3.0000 mL | Freq: Two times a day (BID) | INTRAMUSCULAR | Status: DC
  Administered 2014-08-27: 3 mL via INTRAVENOUS

## 2014-08-27 MED ORDER — ENOXAPARIN SODIUM 60 MG/0.6ML ~~LOC~~ SOLN
60.0000 mg | SUBCUTANEOUS | Status: DC
  Administered 2014-08-27: 60 mg via SUBCUTANEOUS
  Filled 2014-08-27: qty 0.6

## 2014-08-27 NOTE — Discharge Summary (Signed)
Physician Discharge Summary  NATALIO SALOIS FTD:[id number] DOB: 1965-09-04 DOA: 08/26/2014  PCP: Maximino Greenland, MD  Admit date: 08/26/2014 Discharge date: 08/27/2014   Recommendations for Outpatient Follow-Up:   1. The patient will follow-up at [facility] cardiology for a stress test this week. 2. PCP: Please consider further antilipid therapy given his LDL of 110. This result was not available at the time of his discharge.  Discharge Diagnosis:   Principal Problem:    Chest pain at rest, atypical Active Problems:    Hyperlipidemia    Essential hypertension   Discharge disposition:  Home.    Discharge Condition: Improved.  Diet recommendation: Low sodium, heart healthy.    History of Present Illness:   Glenn Oliver is an 49 y.o. male PMH of CAD status post single coronary artery stenting approximately 4 years ago, hyperlipidemia, and hypertension who was admitted 08/26/14 with chest pain at rest.  Hospital Course by Problem:   Principal Problem:  Chest pain at rest in a patient with known CAD - Admitted to telemetry for observation. No ischemic changes noted. - Troponins negative 3. EKG without ischemic changes, poor R-wave progression, unchanged from prior. - Continue aspirin. Continue statin therapy. - Note: pain unrelieved by NTG, and has a pleuritic quality. - Case discussed with Dr. Percival Spanish, with plans to discharge home with outpatient stress test arranged.  Active Problems:  Hyperlipidemia - Continue Crestor. Lipid panel showed total cholesterol of 157 with an LDL of 110.   Essential hypertension - Continue lisinopril. Blood pressure controlled.   Medical Consultants:    Telephone consultation with cardiologist  Discharge Exam:   Filed Vitals:   08/27/14 1310  BP: 125/78  Pulse: 56  Temp: 97.9 F (36.6 C)  Resp: 16   Filed Vitals:   08/27/14 0156 08/27/14 0208 08/27/14 0559 08/27/14 1310  BP: 127/89 126/81 102/76 125/78  Pulse: 66 58  52 56  Temp:  97.6 F (36.4 C) 97.8 F (36.6 C) 97.9 F (36.6 C)  TempSrc:  Oral Oral Oral  Resp: 17 18 18 16   Height:  6\' 1"  (1.854 m)    Weight:  125.102 kg (275 lb 12.8 oz)    SpO2: 98% 100% 100% 98%    Gen:  NAD Cardiovascular:  RRR, No M/R/G Respiratory: Lungs CTAB Gastrointestinal: Abdomen soft, NT/ND with normal active bowel sounds. Extremities: No C/E/C   The results of significant diagnostics from this hospitalization (including imaging, microbiology, ancillary and laboratory) are listed below for reference.     Procedures and Diagnostic Studies:   Dg Chest 2 View  08/26/2014   CLINICAL DATA:  49 year old male with chest pain and shortness of breath  EXAM: CHEST  2 VIEW  COMPARISON:  Chest radiograph dated 05/20/2013  FINDINGS: The heart size and mediastinal contours are within normal limits. Both lungs are clear. The visualized skeletal structures are unremarkable.  IMPRESSION: No active cardiopulmonary disease.   Electronically Signed   By: Anner Crete M.D.   On: 08/26/2014 22:42     Labs:   Basic Metabolic Panel:  Recent Labs Lab 08/26/14 2242  NA 142  K 3.8  CL 109  CO2 28  GLUCOSE 101*  BUN 15  CREATININE 1.45*  CALCIUM 8.5*  MG 2.1   GFR Estimated Creatinine Clearance: 84.5 mL/min (by C-G formula based on Cr of 1.45).  CBC:  Recent Labs Lab 08/26/14 2242  WBC 3.7*  HGB 15.7  HCT 48.3  MCV 92.2  PLT 170   Cardiac  Enzymes:  Recent Labs Lab 08/26/14 2242 08/27/14 0441 08/27/14 1052  TROPONINI <0.03 <0.03 <0.03   Lipid Profile  Recent Labs  08/27/14 0441  CHOL 157  HDL 34*  LDLCALC 110*  TRIG 64  CHOLHDL 4.6    Discharge Instructions:   Discharge Instructions    Call MD for:  extreme fatigue    Complete by:  As directed      Call MD for:  persistant dizziness or light-headedness    Complete by:  As directed      Call MD for:  severe uncontrolled pain    Complete by:  As directed      Diet - low sodium heart  healthy    Complete by:  As directed      Discharge instructions    Complete by:  As directed   Dr. Rosezella [place] office will call you to set up a stress test this week.     Increase activity slowly    Complete by:  As directed             Medication List    TAKE these medications        aspirin 325 MG EC tablet  Take 1 tablet (325 mg total) by mouth daily.     ibuprofen 600 MG tablet  Commonly known as:  ADVIL,MOTRIN  Take 1 tablet (600 mg total) by mouth every 8 (eight) hours as needed for moderate pain.     lisinopril 10 MG tablet  Commonly known as:  PRINIVIL,ZESTRIL  Take 10 mg by mouth at bedtime.     rosuvastatin 10 MG tablet  Commonly known as:  CRESTOR  Take 10 mg by mouth daily.           Follow-up Information    Follow up with Minus Breeding, MD.   Specialty:  Cardiology   Why:  Office will call to set up stress testing.  Call them if you do not hear from them in 1-2 days.   Contact information:   [address] [place] [address] [phone]        Time coordinating discharge: 25 minutes.  Signed:  RAMA,CHRISTINA  Pager [phone] [facility] 08/27/2014, 4:25 PM

## 2014-08-27 NOTE — Discharge Instructions (Signed)
Chest Pain (Nonspecific) °It is often hard to give a specific diagnosis for the cause of chest pain. There is always a chance that your pain could be related to something serious, such as a heart attack or a blood clot in the lungs. You need to follow up with your health care provider for further evaluation. °CAUSES  °· Heartburn. °· Pneumonia or bronchitis. °· Anxiety or stress. °· Inflammation around your heart (pericarditis) or lung (pleuritis or pleurisy). °· A blood clot in the lung. °· A collapsed lung (pneumothorax). It can develop suddenly on its own (spontaneous pneumothorax) or from trauma to the chest. °· Shingles infection (herpes zoster virus). °The chest wall is composed of bones, muscles, and cartilage. Any of these can be the source of the pain. °· The bones can be bruised by injury. °· The muscles or cartilage can be strained by coughing or overwork. °· The cartilage can be affected by inflammation and become sore (costochondritis). °DIAGNOSIS  °Lab tests or other studies may be needed to find the cause of your pain. Your health care provider may have you take a test called an ambulatory electrocardiogram (ECG). An ECG records your heartbeat patterns over a 24-hour period. You may also have other tests, such as: °· Transthoracic echocardiogram (TTE). During echocardiography, sound waves are used to evaluate how blood flows through your heart. °· Transesophageal echocardiogram (TEE). °· Cardiac monitoring. This allows your health care provider to monitor your heart rate and rhythm in real time. °· Holter monitor. This is a portable device that records your heartbeat and can help diagnose heart arrhythmias. It allows your health care provider to track your heart activity for several days, if needed. °· Stress tests by exercise or by giving medicine that makes the heart beat faster. °TREATMENT  °· Treatment depends on what may be causing your chest pain. Treatment may include: °· Acid blockers for  heartburn. °· Anti-inflammatory medicine. °· Pain medicine for inflammatory conditions. °· Antibiotics if an infection is present. °· You may be advised to change lifestyle habits. This includes stopping smoking and avoiding alcohol, caffeine, and chocolate. °· You may be advised to keep your head raised (elevated) when sleeping. This reduces the chance of acid going backward from your stomach into your esophagus. °Most of the time, nonspecific chest pain will improve within 2-3 days with rest and mild pain medicine.  °HOME CARE INSTRUCTIONS  °· If antibiotics were prescribed, take them as directed. Finish them even if you start to feel better. °· For the next few days, avoid physical activities that bring on chest pain. Continue physical activities as directed. °· Do not use any tobacco products, including cigarettes, chewing tobacco, or electronic cigarettes. °· Avoid drinking alcohol. °· Only take medicine as directed by your health care provider. °· Follow your health care provider's suggestions for further testing if your chest pain does not go away. °· Keep any follow-up appointments you made. If you do not go to an appointment, you could develop lasting (chronic) problems with pain. If there is any problem keeping an appointment, call to reschedule. °SEEK MEDICAL CARE IF:  °· Your chest pain does not go away, even after treatment. °· You have a rash with blisters on your chest. °· You have a fever. °SEEK IMMEDIATE MEDICAL CARE IF:  °· You have increased chest pain or pain that spreads to your arm, neck, jaw, back, or abdomen. °· You have shortness of breath. °· You have an increasing cough, or you cough   up blood.  You have severe back or abdominal pain.  You feel nauseous or vomit.  You have severe weakness.  You faint.  You have chills. This is an emergency. Do not wait to see if the pain will go away. Get medical help at once. Call your local emergency services (911 in [place] Do not drive  yourself to the hospital. MAKE SURE YOU:   Understand these instructions.  Will watch your condition.  Will get help right away if you are not doing well or get worse. Document Released: 11/06/2004 Document Revised: 02/01/2013 Document Reviewed: 09/02/2007 [facility] Patient Information 2015 [place], [place]. This information is not intended to replace advice given to you by your health care provider. Make sure you discuss any questions you have with your health care provider.  Aspirin and Your Heart Aspirin affects the way your blood clots and helps "thin" the blood. Aspirin has many uses in heart disease. It may be used as a primary prevention to help reduce the risk of heart related events. It also can be used as a secondary measure to prevent more heart attacks or to prevent additional damage from blood clots.  ASPIRIN MAY HELP IF YOU:  Have had a heart attack or chest pain.  Have undergone open heart surgery such as CABG (Coronary Artery Bypass Surgery).  Have had coronary angioplasty with or without stents.  Have experienced a stroke or TIA (transient ischemic attack).  Have peripheral vascular disease (PAD).  Have chronic heart rhythm problems such as atrial fibrillation.  Are at risk for heart disease. BEFORE STARTING ASPIRIN Before you start taking aspirin, your caregiver will need to review your medical history. Many things will need to be taken into consideration, such as:  Smoking status.  Blood pressure.  Diabetes.  Gender.  Weight.  Cholesterol level. ASPIRIN DOSES  Aspirin should only be taken on the advice of your caregiver. Talk to your caregiver about how much aspirin you should take. Aspirin comes in different doses such as:  81 mg.  162 mg.  325 mg.  The aspirin dose you take may be affected by many factors, some of which include:  Your current medications, especially if your are taking blood-thinners or anti-platelet medicine.  Liver  function.  Heart disease risk.  Age.  Aspirin comes in two forms:  Non-enteric-coated. This type of aspirin does not have a coating and is absorbed faster. Non-enteric coated aspirin is recommended for patients experiencing chest pain symptoms. This type of aspirin also comes in a chewable form.  Enteric-coated. This means the aspirin has a special coating that releases the medicine very slowly. Enteric-coated aspirin causes less stomach upset. This type of aspirin should not be chewed or crushed. ASPIRIN SIDE EFFECTS Daily use of aspirin can increase your risk of serious side effects. Some of these include:  Increased bleeding. This can range from a cut that does not stop bleeding to more serious problems such as stomach bleeding or bleeding into the brain (Intracerebral bleeding).  Increased bruising.  Stomach upset.  An allergic reaction such as red, itchy skin.  Increased risk of bleeding when combined with non-steroidal anti-inflammatory medicine (NSAIDS).  Alcohol should be drank in moderation when taking aspirin. Alcohol can increase the risk of stomach bleeding when taken with aspirin.  Aspirin should not be given to children less than 60 years of age due to the association of Reye syndrome. Reye syndrome is a serious illness that can affect the brain and liver. Studies have linked  Reye syndrome with aspirin use in children.  People that have nasal polyps have an increased risk of developing an aspirin allergy. SEEK MEDICAL CARE IF:   You develop an allergic reaction such as:  Hives.  Itchy skin.  Swelling of the lips, tongue or face.  You develop stomach pain.  You have unusual bleeding or bruising.  You have ringing in your ears. SEEK IMMEDIATE MEDICAL CARE IF:   You have severe chest pain, especially if the pain is crushing or pressure-like and spreads to the arms, back, neck, or jaw. THIS IS AN EMERGENCY. Do not wait to see if the pain will go away. Get  medical help at once. Call your local emergency services (911 in the [place] DO NOT drive yourself to the hospital.  You have stroke-like symptoms such as:  Loss of vision.  Difficulty talking.  Numbness or weakness on one side of your body.  Numbness or weakness in your arm or leg.  Not thinking clearly or feeling confused.  Your bowel movements are bloody, dark red or black in color.  You vomit or cough up blood.  You have blood in your urine.  You have shortness of breath, coughing or wheezing. MAKE SURE YOU:   Understand these instructions.  Will monitor your condition.  Seek immediate medical care if necessary. Document Released: 01/10/2008 Document Revised: 05/24/2012 Document Reviewed: 05/04/2013 [facility] Patient Information 2015 [place], [place]. This information is not intended to replace advice given to you by your health care provider. Make sure you discuss any questions you have with your health care provider.

## 2014-08-27 NOTE — H&P (Signed)
[facility] History and Physical  CLAYTEN ALLCOCK ERD:[id number] DOB: Dec 12, 1965 DOA: 08/26/2014  Referring physician: Alvina Chou, PA-C PCP: Maximino Greenland, MD   Chief Complaint: Chest pain.  HPI: Glenn Oliver is a 49 y.o. male with a past medical history of CAD, status post single coronary artery stenting 2 years ago in [place], [place], hyperlipidemia, hypertension, colonic polyps, who comes the ER due to sudden precordial chest pain, pressure-like, nonradiating, associated with dyspnea and diaphoresis. He denies dizziness palpitations or nausea. This happened while he was sitting down watching TV around 2200. He stated that he ate dinner around 2000 and he not eat anything else after that. He had a similar episode about a year ago while he was driving on his way to work and ended up driving himself to the hospital, but states that no further workup was needed after that admission.   He is very active at work, he states the he walks a lot and lifts heavy objects without chest pain, or dyspnea. He denies pitting edema all the lower extremities, PND or orthopnea. He denies cigarette smoking, alcohol use or recreational drugs.  His currently no acute distress after being medicated with a second dose of analgesics.   Review of Systems:  Constitutional:  No weight loss, night sweats, Fevers, chills, fatigue.  HEENT:  No headaches, Difficulty swallowing,Tooth/dental problems,Sore throat,  No sneezing, itching, ear ache, nasal congestion, post nasal drip,  Cardio-vascular:   positive chest pain,   negative Orthopnea,  PND, swelling in lower extremities, anasarca, dizziness, palpitations  GI:  No heartburn, indigestion, abdominal pain, nausea, vomiting, diarrhea, change in bowel habits, loss of appetite  Resp:  Positive shortness of breath at rest tonight while watching TV at home.No shortness of breath with exertion  at work . No excess mucus, no productive cough, No  non-productive cough, No coughing up of blood.No change in color of mucus.No wheezing.No chest wall deformity  Skin:  no rash or lesions.  GU:  no dysuria, change in color of urine, no urgency or frequency. No flank pain.  Musculoskeletal:  No joint pain or swelling. No decreased range of motion. No back pain.  Psych:  No change in mood or affect. No depression or anxiety. No memory loss.   Past Medical History  Diagnosis Date  . Hypertension   . Hyperlipidemia     no meds   . Hx of adenomatous colonic polyps 06/12/2014   Past Surgical History  Procedure Laterality Date  . Rotator cuff repair      right  . Colonoscopy     Social History:  reports that he has never smoked. He has never used smokeless tobacco. He reports that he does not drink alcohol or use illicit drugs.  No Known Allergies  Family History  Problem Relation Age of Onset  . Prostate cancer Maternal Uncle   . Colon cancer Neg Hx   . Esophageal cancer Neg Hx   . Rectal cancer Neg Hx   . Stomach cancer Neg Hx      Prior to Admission medications   Medication Sig Start Date End Date Taking? Authorizing Provider  lisinopril (PRINIVIL,ZESTRIL) 10 MG tablet Take 10 mg by mouth at bedtime.    Yes Historical Provider, MD  rosuvastatin (CRESTOR) 10 MG tablet Take 10 mg by mouth daily.   Yes Historical Provider, MD   Physical Exam: Filed Vitals:   08/26/14 2221 08/26/14 2246 08/26/14 2332  BP: 158/93  123/82  Pulse: 66  65  Temp: 98.1 F (36.7 C)    TempSrc: Oral    Resp: 16  18  Height:  6\' 1"  (1.854 m)   Weight:  126.1 kg (278 lb)   SpO2: 100%  97%    Wt Readings from Last 3 Encounters:  08/26/14 126.1 kg (278 lb)  06/13/14 127.461 kg (281 lb)  05/30/14 127.642 kg (281 lb 6.4 oz)    General:  Appears calm and comfortable Eyes: PERRL, normal lids, irises & conjunctiva ENT: grossly normal hearing, lips & tongue Neck: no LAD, masses or thyromegaly Cardiovascular: RRR, no m/r/g. No LE  edema. Telemetry: SR, no arrhythmias  Respiratory: CTA bilaterally, no w/r/r. Normal respiratory effort. Abdomen: soft, ntnd Skin: no rash or induration seen on limited exam Musculoskeletal:  very muscular, well built, grossly normal tone BUE/BLE Psychiatric: grossly normal mood and affect, speech fluent and appropriate Neurologic: grossly non-focal.          Labs on Admission:  Basic Metabolic Panel:  Recent Labs Lab 08/26/14 2242  NA 142  K 3.8  CL 109  CO2 28  GLUCOSE 101  BUN 15  CREATININE 1.45  CALCIUM 8.5   Liver Function Tests: No results for input(s): AST, ALT, ALKPHOS, BILITOT, PROT, ALBUMIN in the last 168 hours. No results for input(s): LIPASE, AMYLASE in the last 168 hours. No results for input(s): AMMONIA in the last 168 hours. CBC:  Recent Labs Lab 08/26/14 2242  WBC 3.7  HGB 15.7  HCT 48.3  MCV 92.2  PLT 170   Cardiac Enzymes:  Recent Labs Lab 08/26/14 2242  TROPONINI <0.03    BNP (last 3 results) No results for input(s): BNP in the last 8760 hours.  ProBNP (last 3 results) No results for input(s): PROBNP in the last 8760 hours.  CBG: No results for input(s): GLUCAP in the last 168 hours.  Radiological Exams on Admission: Dg Chest 2 View  08/26/2014   CLINICAL DATA:  49 year old male with chest pain and shortness of breath  EXAM: CHEST  2 VIEW  COMPARISON:  Chest radiograph dated 05/20/2013  FINDINGS: The heart size and mediastinal contours are within normal limits. Both lungs are clear. The visualized skeletal structures are unremarkable.  IMPRESSION: No active cardiopulmonary disease.   Electronically Signed   By: Anner Crete M.D.   On: 08/26/2014 22:42    EKG: Independently reviewed. Date/Time: Saturday August 26 2014 23:32:17 EDT Ventricular Rate: 66 PR Interval: 172 QRS Duration: 88 QT Interval: 393 QTC Calculation: 412 R Axis: 26 Sinus rhythm Abnormal R-wave progression, early transition No significant change  since last tracing.  Assessment/Plan Principal Problem:   Chest pain at rest Active Problems:   Hyperlipidemia   Essential hypertension   Admit for telemetry monitoring, serial troponin levels to evaluate trend.   check lipid profile and continue aspirin and Crestor and lisinopril. The patient was advised to ask his PCP for cardiology referral so he can establish care and follow-up on a regular basis. He hasn't had a cardiology follow-up or workup since he was discharged 2 years ago after his PTCA and stenting.  Code Status: Full. or must be presumed, indicate so) DVT Prophylaxis: Lovenox. Family Communication:  [place], Carpenter [phone]  Disposition Plan: With outpatient follow-up  Time spent: 60 minutes.  Reubin Milan [facility] Pager [phone].

## 2014-08-30 ENCOUNTER — Encounter: Payer: Self-pay | Admitting: *Deleted

## 2014-08-30 ENCOUNTER — Telehealth: Payer: Self-pay | Admitting: *Deleted

## 2014-08-30 DIAGNOSIS — R079 Chest pain, unspecified: Secondary | ICD-10-CM

## 2014-08-30 NOTE — Telephone Encounter (Signed)
-----   Message from Minus Breeding, MD sent at 08/29/2014  9:29 AM EDT ----- Need to try to get this patient a stress test.  I suspect a POET (Plain Old Exercise Treadmill) would be OK if he can walk it.  However, I could not get in contact with him.  Will you please try to call and talk to him and document whether or not we are successful?  Thanks.  Maylon Cos

## 2014-08-30 NOTE — Telephone Encounter (Signed)
error 

## 2014-08-30 NOTE — Telephone Encounter (Signed)
Thank you.  I appreciate your great work.

## 2014-08-30 NOTE — Telephone Encounter (Signed)
Patient's only phone number on file goes to phone that goes straight to message which states it does not take incoming calls.  Exercise stress test ordered, letter sent to patient address on file requesting he contact office.

## 2014-08-31 ENCOUNTER — Ambulatory Visit (HOSPITAL_COMMUNITY)
Admission: RE | Admit: 2014-08-31 | Discharge: 2014-08-31 | Disposition: A | Discharge status: Home / Self Care | Payer: Managed Care, Other (non HMO) | Source: Ambulatory Visit | Attending: Cardiovascular Disease | Admitting: Cardiovascular Disease

## 2014-08-31 ENCOUNTER — Encounter (HOSPITAL_COMMUNITY): Payer: Self-pay | Admitting: *Deleted

## 2014-08-31 DIAGNOSIS — R079 Chest pain, unspecified: Secondary | ICD-10-CM

## 2014-09-01 LAB — EXERCISE TOLERANCE TEST
Estimated workload: 9.6 METS
Exercise duration (min): 7 min
Exercise duration (sec): 43 s
MPHR: 170 {beats}/min
Peak HR: 151 {beats}/min
Percent HR: 88 %
RPE: 13
Rest HR: 71 {beats}/min

## 2014-09-05 ENCOUNTER — Telehealth: Payer: Self-pay | Admitting: Cardiology

## 2014-09-05 NOTE — Telephone Encounter (Signed)
Patient is retuning a call from earlier this morning.

## 2014-09-05 NOTE — Telephone Encounter (Signed)
Patient received letter in mail dated July 20 stating we were unable to contact him by phone. Indicated he needed to make appt for treadmill. (See previous triage note from Dr. Percival Spanish).  Patient had gxt on July 21. He is confused! Does he need any further follow up?

## 2014-09-05 NOTE — Telephone Encounter (Signed)
SPOKE TO PATIENT NO MORE TESTS NEEDED.

## 2014-09-05 NOTE — Telephone Encounter (Signed)
CALLED LEFT MESSAGE on patient's voicemail  no further test are needed. Patient had gxt. Letter was sent prior to gxt appointment.

## 2015-01-23 ENCOUNTER — Emergency Department (HOSPITAL_BASED_OUTPATIENT_CLINIC_OR_DEPARTMENT_OTHER)
Admission: EM | Admit: 2015-01-23 | Discharge: 2015-01-23 | Disposition: A | Discharge status: Home / Self Care | Payer: Managed Care, Other (non HMO) | Attending: Emergency Medicine | Admitting: Emergency Medicine

## 2015-01-23 ENCOUNTER — Encounter (HOSPITAL_BASED_OUTPATIENT_CLINIC_OR_DEPARTMENT_OTHER): Payer: Self-pay | Admitting: *Deleted

## 2015-01-23 DIAGNOSIS — R040 Epistaxis: Secondary | ICD-10-CM | POA: Insufficient documentation

## 2015-01-23 DIAGNOSIS — I10 Essential (primary) hypertension: Secondary | ICD-10-CM | POA: Insufficient documentation

## 2015-01-23 DIAGNOSIS — Z79899 Other long term (current) drug therapy: Secondary | ICD-10-CM | POA: Diagnosis not present

## 2015-01-23 DIAGNOSIS — Z7982 Long term (current) use of aspirin: Secondary | ICD-10-CM | POA: Diagnosis not present

## 2015-01-23 DIAGNOSIS — Z86010 Personal history of colonic polyps: Secondary | ICD-10-CM | POA: Diagnosis not present

## 2015-01-23 DIAGNOSIS — E785 Hyperlipidemia, unspecified: Secondary | ICD-10-CM | POA: Insufficient documentation

## 2015-01-23 MED ORDER — PHENYLEPHRINE HCL 0.5 % NA SOLN
1.0000 [drp] | Freq: Once | NASAL | Status: AC
  Administered 2015-01-23: 1 [drp] via NASAL

## 2015-01-23 MED ORDER — PHENYLEPHRINE HCL 0.5 % NA SOLN
NASAL | Status: AC
  Administered 2015-01-23: 1 [drp] via NASAL
  Filled 2015-01-23: qty 15

## 2015-01-23 NOTE — Discharge Instructions (Signed)
Neo-Synephrine spray: 1 spray in each nostril 3 times daily for the next several days.  Hold pressure for 15 minutes if rebleeding occurs.  If this does not resolve the bleeding, return to the ER to be reevaluated.   Nosebleed Nosebleeds are common. They are due to a crack in the inside lining of your nose (mucous membrane) or from a small blood vessel that starts to bleed. Nosebleeds can be caused by many conditions, such as injury, infections, dry mucous membranes or dry climate, medicines, nose picking, and home heating and cooling systems. Most nosebleeds come from blood vessels in the front of your nose. HOME CARE INSTRUCTIONS   Try controlling your nosebleed by pinching your nostrils gently and continuously for at least 10 minutes.  Avoid blowing or sniffing your nose for a number of hours after having a nosebleed.  Do not put gauze inside your nose yourself. If your nose was packed by your health care provider, try to maintain the pack inside of your nose until your health care provider removes it.  If a gauze pack was used and it starts to fall out, gently replace it or cut off the end of it.  If a balloon catheter was used to pack your nose, do not cut or remove it unless your health care provider has instructed you to do that.  Avoid lying down while you are having a nosebleed. Sit up and lean forward.  Use a nasal spray decongestant to help with a nosebleed as directed by your health care provider.  Do not use petroleum jelly or mineral oil in your nose. These can drip into your lungs.  Maintain humidity in your home by using less air conditioning or by using a humidifier.  Aspirinand blood thinners make bleeding more likely. If you are prescribed these medicines and you suffer from nosebleeds, ask your health care provider if you should stop taking the medicines or adjust the dose. Do not stop medicines unless directed by your health care provider  Resume your normal  activities as you are able, but avoid straining, lifting, or bending at the waist for several days.  If your nosebleed was caused by dry mucous membranes, use over-the-counter saline nasal spray or gel. This will keep the mucous membranes moist and allow them to heal. If you must use a lubricant, choose the water-soluble variety. Use it only sparingly, and do not use it within several hours of lying down.  Keep all follow-up visits as directed by your health care provider. This is important. SEEK MEDICAL CARE IF:  You have a fever.  You get frequent nosebleeds.  You are getting nosebleeds more often. SEEK IMMEDIATE MEDICAL CARE IF:  Your nosebleed lasts longer than 20 minutes.  Your nosebleed occurs after an injury to your face, and your nose looks crooked or broken.  You have unusual bleeding from other parts of your body.  You have unusual bruising on other parts of your body.  You feel light-headed or you faint.  You become sweaty.  You vomit blood.  Your nosebleed occurs after a head injury.   This information is not intended to replace advice given to you by your health care provider. Make sure you discuss any questions you have with your health care provider.   Document Released: 11/06/2004 Document Revised: 02/17/2014 Document Reviewed: 09/12/2013 Elsevier Interactive Patient Education [employer].

## 2015-01-23 NOTE — ED Provider Notes (Signed)
CSN: [id number]     Arrival date & time 01/23/15  1027 History   First MD Initiated Contact with Patient 01/23/15 1151     Chief Complaint  Patient presents with  . Epistaxis     (Consider location/radiation/quality/duration/timing/severity/associated sxs/prior Treatment) HPI Comments: Patient with intermittent nose bleeding for the past 2 days in the absence of any injury or trauma. He denies any blood thinner use.  Patient is a 49 y.o. male presenting with nosebleeds. The history is provided by the patient.  Epistaxis Location:  L nare Severity:  Moderate Duration:  1 hour Timing:  Constant Progression:  Unchanged Chronicity:  New Context: not anticoagulants, not aspirin use, not bleeding disorder and not trauma   Relieved by:  Nothing Worsened by:  Nothing tried Ineffective treatments:  None tried   Past Medical History  Diagnosis Date  . Hypertension   . Hyperlipidemia     no meds   . Hx of adenomatous colonic polyps 06/12/2014   Past Surgical History  Procedure Laterality Date  . Rotator cuff repair      right  . Colonoscopy     Family History  Problem Relation Age of Onset  . Prostate cancer Maternal Uncle   . Colon cancer Neg Hx   . Esophageal cancer Neg Hx   . Rectal cancer Neg Hx   . Stomach cancer Neg Hx    Social History  Substance Use Topics  . Smoking status: Never Smoker   . Smokeless tobacco: Never Used  . Alcohol Use: No    Review of Systems  HENT: Positive for nosebleeds.   All other systems reviewed and are negative.     Allergies  Review of patient's allergies indicates no known allergies.  Home Medications   Prior to Admission medications   Medication Sig Start Date End Date Taking? Authorizing Provider  aspirin EC 325 MG EC tablet Take 1 tablet (325 mg total) by mouth daily. 08/27/14   Venetia Maxon Rama, MD  ibuprofen (ADVIL,MOTRIN) 600 MG tablet Take 1 tablet (600 mg total) by mouth every 8 (eight) hours as needed for moderate  pain. 08/27/14   Christina P Rama, MD  lisinopril (PRINIVIL,ZESTRIL) 10 MG tablet Take 10 mg by mouth at bedtime.     Historical Provider, MD  rosuvastatin (CRESTOR) 10 MG tablet Take 10 mg by mouth daily.    Historical Provider, MD   BP 145/99 mmHg  Pulse 78  Temp(Src) 98.6 F (37 C)  Resp 18  SpO2 99% Physical Exam  Constitutional: He is oriented to person, place, and time. He appears well-developed and well-nourished. No distress.  HENT:  Head: Normocephalic and atraumatic.  Bilateral nares are clear. There is no active bleeding. Nasal mucosa appears clear.  Neck: Normal range of motion. Neck supple.  Neurological: He is alert and oriented to person, place, and time.  Skin: Skin is warm and dry. He is not diaphoretic.  Nursing note and vitals reviewed.   ED Course  Procedures (including critical care time) Labs Review Labs Reviewed - No data to display  Imaging Review No results found. I have personally reviewed and evaluated these images and lab results as part of my medical decision-making.    MDM   Final diagnoses:  None    Patient presents with nosebleeds intermittently for the past 2 days. His nosebleed has stopped with direct pressure here in the ER and I see no reason to proceed with a packing. He was given Neo-Synephrine spray has had  no further bleeding. He will be discharged with Neo-Synephrine spray and advised to return as needed. I suspect his nosebleeds are related to dry air.    Veryl Speak, MD 01/23/15 [phone]

## 2015-01-23 NOTE — ED Notes (Signed)
Patient c/o intermittent nosebleed for the past two days, BP elevated today

## 2015-10-04 ENCOUNTER — Ambulatory Visit (INDEPENDENT_AMBULATORY_CARE_PROVIDER_SITE_OTHER): Payer: Managed Care, Other (non HMO) | Admitting: Podiatry

## 2015-10-04 ENCOUNTER — Encounter: Payer: Self-pay | Admitting: Podiatry

## 2015-10-04 VITALS — BP 168/108 | HR 76 | Resp 12

## 2015-10-04 DIAGNOSIS — L603 Nail dystrophy: Secondary | ICD-10-CM | POA: Diagnosis not present

## 2015-10-04 DIAGNOSIS — M79676 Pain in unspecified toe(s): Secondary | ICD-10-CM

## 2015-10-04 DIAGNOSIS — B351 Tinea unguium: Secondary | ICD-10-CM | POA: Diagnosis not present

## 2015-10-04 NOTE — Progress Notes (Signed)
   Subjective:    Patient ID: Glenn Oliver, male    DOB: [birth year], 50 y.o.   MRN: [id number]  HPI: He presents today with chief complaint of dry cracked skin on the plantar aspect of bilateral foot in between the toes. He is also complaining of bilateral thick discolored toenails over the past 5 years which have worsened. He has tried over-the-counter antifungal solution to no avail.    Review of Systems  Skin: Positive for color change.       Objective:   Physical Exam: Vital signs are stable he is alert and oriented 3. Pulses are palpable. Neurologic sensorium is intact. Deep tendon reflexes are intact. Muscle strength is normal. Deep tendon reflexes are normal orthopedic evaluation of his WAS dissected for range of motion without crepitus. Cutaneous evaluationof well-hydrated cutis he has dry xerotic skin plantar aspect bilateral foot in a moccasin distribution typical of tinea pedis. He also has thick yellow dystrophic onychomycotic nails 1 through 5 bilateral.        Assessment & Plan:  Nail dystrophy bilateral with tinea pedis. Suspect onychomycosis.  Plan: To samples of the skin and nail to be sent for pathologic evaluation today.

## 2015-11-01 ENCOUNTER — Encounter: Payer: Self-pay | Admitting: Podiatry

## 2015-11-01 ENCOUNTER — Ambulatory Visit (INDEPENDENT_AMBULATORY_CARE_PROVIDER_SITE_OTHER): Payer: Managed Care, Other (non HMO) | Admitting: Podiatry

## 2015-11-01 DIAGNOSIS — L603 Nail dystrophy: Secondary | ICD-10-CM | POA: Diagnosis not present

## 2015-11-01 LAB — HEPATIC FUNCTION PANEL
ALT: 26 U/L (ref 9–46)
AST: 25 U/L (ref 10–35)
Albumin: 4.1 g/dL (ref 3.6–5.1)
Alkaline Phosphatase: 61 U/L (ref 40–115)
Bilirubin, Direct: 0.2 mg/dL (ref <=0.2)
Indirect Bilirubin: 0.5 mg/dL (ref 0.2–1.2)
Total Bilirubin: 0.7 mg/dL (ref 0.2–1.2)
Total Protein: 6.7 g/dL (ref 6.1–8.1)

## 2015-11-01 MED ORDER — TERBINAFINE HCL 250 MG PO TABS: 250.0000 mg | ORAL_TABLET | Freq: Every day | ORAL | 0 refills | Status: DC

## 2015-11-01 NOTE — Progress Notes (Signed)
He presents today for follow-up of his fungal biopsy. He denies any changes in his past medical history medications or allergies.  Objective: Pulses are palpable his clinic exam is unchanged. Pathology report does demonstrate dermatophyte and saprophytic infection.  Assessment: Onychomycosis.  Plan: After discussing all the pros and cons and the risks involved oral therapy was decided upon. We will start with Lamisil 250 mg tablets 1 by mouth as directed 30 days. We also will request P profile to be performed twice. We will initiate the requisition today. I will follow-up with him in 1 month.

## 2015-11-01 NOTE — Patient Instructions (Signed)

## 2015-11-02 ENCOUNTER — Ambulatory Visit (INDEPENDENT_AMBULATORY_CARE_PROVIDER_SITE_OTHER): Payer: Managed Care, Other (non HMO)

## 2015-11-02 ENCOUNTER — Encounter (HOSPITAL_COMMUNITY): Payer: Self-pay | Admitting: Emergency Medicine

## 2015-11-02 ENCOUNTER — Ambulatory Visit (HOSPITAL_COMMUNITY)
Admission: EM | Admit: 2015-11-02 | Discharge: 2015-11-02 | Disposition: A | Discharge status: Home / Self Care | Payer: Managed Care, Other (non HMO) | Attending: Family Medicine | Admitting: Family Medicine

## 2015-11-02 DIAGNOSIS — S63619A Unspecified sprain of unspecified finger, initial encounter: Secondary | ICD-10-CM

## 2015-11-02 NOTE — ED Triage Notes (Signed)
The patient presented to the [facility] with a complaint of pain to his right middle finger secondary to dislocating it last night. The patient stated that he was pushing himself up on a couch and bent his finger and it dislocated. The patient stated that he reduced the dislocation himself but is still having pain.

## 2015-11-02 NOTE — ED Provider Notes (Signed)
[place]    CSN: [id number] Arrival date & time: 11/02/15  [id number]  First Provider Contact:  None       History   Chief Complaint Chief Complaint  Patient presents with  . Finger Injury    HPI Glenn Oliver is a 50 y.o. male.   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 He pulled on it and it seemed to come back and joint. He's had continued pain all day today and the MCP joint. No other injuries occurred.  Patient had a fractured finger back in 1983 when he was in high school.      Past Medical History:  Diagnosis Date  . Hx of adenomatous colonic polyps 06/12/2014  . Hyperlipidemia    no meds   . Hypertension     Patient Active Problem List   Diagnosis Date Noted  . Chest pain at rest 08/27/2014  . Hx of adenomatous colonic polyps 06/12/2014  . Hyperlipidemia 04/10/2010  . Hypertension 04/10/2010  . CHEST PAIN 04/10/2010  . ALLERGY 04/10/2010    Past Surgical History:  Procedure Laterality Date  . COLONOSCOPY    . ROTATOR CUFF REPAIR     right       Home Medications    Prior to Admission medications   Medication Sig Start Date End Date Taking? Authorizing Provider  terbinafine (LAMISIL) 250 MG tablet Take 1 tablet (250 mg total) by mouth daily. 11/01/15   Max Villa Herb, DPM    Family History Family History  Problem Relation Age of Onset  . Prostate cancer Maternal Uncle   . Colon cancer Neg Hx   . Esophageal cancer Neg Hx   . Rectal cancer Neg Hx   . Stomach cancer Neg Hx     Social History Social History  Substance Use Topics  . Smoking status: Never Smoker  . Smokeless tobacco: Never Used  . Alcohol use No     Allergies   Review of patient's allergies indicates no known allergies.   Review of Systems Review of Systems  Constitutional: Negative.   HENT: Negative.   Neurological: Negative.      Physical Exam Triage Vital  Signs ED Triage Vitals  Enc Vitals Group     BP 11/02/15 1935 143/95     Pulse Rate 11/02/15 1935 74     Resp 11/02/15 1935 16     Temp 11/02/15 1935 98.6 F (37 C)     Temp Source 11/02/15 1935 Oral     SpO2 11/02/15 1935 98 %     Weight --      Height --      Head Circumference --      Peak Flow --      Pain Score 11/02/15 1948 5     Pain Loc --      Pain Edu? --      Excl. in [place]? --    No data found.   Updated Vital Signs BP 143/95 (BP Location: Left Arm)   Pulse 74   Temp 98.6 F (37 C) (Oral)   Resp 16   SpO2 98%    Physical Exam  Constitutional: He is oriented to person, place, and time. He appears well-developed and well-nourished.  HENT:  Head: Normocephalic.  Right Ear: External ear normal.  Left Ear: External ear normal.  Mouth/Throat: Oropharynx is clear and moist.  Eyes: Conjunctivae and EOM are normal.  Pupils are equal, round, and reactive to light.  Neck: Normal range of motion. Neck supple.  Musculoskeletal:  Patient has full range of motion of his right hand. He does experience some pain when flexing the right middle finger. There is some mild joint swelling at the MCP joint of his right middle finger, but there is minimal tenderness at the MCP joint, PIP joint or the DIP joint.  Neurological: He is alert and oriented to person, place, and time.  Nursing note and vitals reviewed.    UC Treatments / Results  Labs (all labs ordered are listed, but only abnormal results are displayed) Labs Reviewed - No data to display  EKG  EKG Interpretation None       Radiology No results found. [facility] reading (PRIMARY) by  Dr. Joseph Art:  STS, no fracture seen.   Procedures Procedures (including critical care time)  Medications Ordered in UC Medications - No data to display   Initial Impression / Assessment and Plan / UC Course  I have reviewed the triage vital signs and the nursing notes.  Pertinent labs & imaging results that were available  during my care of the patient were reviewed by me and considered in my medical decision making (see chart for details).  Clinical Course      Final Clinical Impressions(s) / UC Diagnoses   Final diagnoses:  None    New Prescriptions New Prescriptions   No medications on file     Robyn Haber, MD 11/02/15 2038

## 2015-11-02 NOTE — Discharge Instructions (Signed)
We do not see a fracture on the x-ray. I will have a radiologist look at this again later tonight and call you if there is any change in the diagnosis.  As we discussed, you will need to have the finger buddy taped for about 3 weeks to ensure good healing. This will allow you to continue working. He may take off the buddy tape to take a shower but otherwise keep it on 24/7.

## 2015-11-08 ENCOUNTER — Telehealth: Payer: Self-pay

## 2015-11-08 NOTE — Telephone Encounter (Signed)
Spoke with pt regarding hepatic function panel results, advised him that all the results were WNL, continue with prescribed medication and keep any f/u appt

## 2015-11-29 ENCOUNTER — Ambulatory Visit: Payer: Managed Care, Other (non HMO) | Admitting: Podiatry

## 2015-12-04 ENCOUNTER — Encounter: Payer: Self-pay | Admitting: Podiatry

## 2015-12-04 ENCOUNTER — Ambulatory Visit (INDEPENDENT_AMBULATORY_CARE_PROVIDER_SITE_OTHER): Payer: Managed Care, Other (non HMO) | Admitting: Podiatry

## 2015-12-04 DIAGNOSIS — Z79899 Other long term (current) drug therapy: Secondary | ICD-10-CM

## 2015-12-04 MED ORDER — TERBINAFINE HCL 250 MG PO TABS: 250.0000 mg | ORAL_TABLET | Freq: Every day | ORAL | 0 refills | Status: DC

## 2015-12-04 NOTE — Progress Notes (Signed)
He presents today for follow-up of his Lamisil therapy. He states that he can tell that his skin on his feet are clearing with the use of the medication. He sees no nail changes as of yet. He states that he has a new job and will be moving to [place] within the next couple of days. He denies fever chills nausea and vomiting muscle aches and pains rashes or itching.  Objective: Vital signs are stable he is alert and oriented 3 no change in the nails as of yet. There his skin has cleared almost 100%.  Assessment: Resolution of tinea pedis. Onychomycosis treated with long-term therapy of Lamisil.  Plan: Requested another liver profile and CBC today and suggested he do ahead and get started on his Lamisil 250 mg tablets 1 by mouth daily for the next 90 days. I will follow-up with him in 4 months if he is present otherwise I provided him with a prescription for the medication and I also provided him with a name for a doctor in [place].

## 2016-10-07 ENCOUNTER — Ambulatory Visit (HOSPITAL_COMMUNITY)
Admission: EM | Admit: 2016-10-07 | Discharge: 2016-10-07 | Disposition: A | Discharge status: Home / Self Care | Payer: BC Managed Care – PPO | Attending: Emergency Medicine | Admitting: Emergency Medicine

## 2016-10-07 ENCOUNTER — Encounter (HOSPITAL_COMMUNITY): Payer: Self-pay | Admitting: Emergency Medicine

## 2016-10-07 DIAGNOSIS — M79674 Pain in right toe(s): Secondary | ICD-10-CM | POA: Diagnosis not present

## 2016-10-07 DIAGNOSIS — M25561 Pain in right knee: Secondary | ICD-10-CM | POA: Diagnosis not present

## 2016-10-07 MED ORDER — NAPROXEN 500 MG PO TABS: 500.0000 mg | ORAL_TABLET | Freq: Two times a day (BID) | ORAL | 0 refills | Status: AC

## 2016-10-07 NOTE — Discharge Instructions (Signed)
Start Naproxen as directed. Knee sleeve for activity. Ice compress as needed. Your toe pain could be due to stress from knee pain, but could also be due to gout, follow up with PCP for further evaluation and treatment if symptoms do not resolve, or if you have similar symptoms in the future. Insoles for shoes to offer better support. If you experience worsening of symptoms, increased joint swelling/redness/warmth, follow up for reevaluation   I have attached DASH diet eating plan to help with better blood pressure control, please follow up with PCP for further evaluation and management. Go to the emergency room if experiencing chest pain, shortness of breath, weakness, dizziness, headache, blurry vision.

## 2016-10-07 NOTE — ED Triage Notes (Signed)
PT reports right foot and right knee pain since Friday. PT has had issues with right knee, but never had pain in right foot. No injury

## 2016-10-07 NOTE — ED Provider Notes (Signed)
[name]    CSN: [id number] Arrival date & time: 10/07/16  1052     History   Chief Complaint Chief Complaint  Patient presents with  . Knee Pain  . Foot Pain    HPI Glenn Oliver is a 51 y.o. male.   51 year old male with history of hyperlipidemia, hypertension, right knee pain, comes in for 5 day history of right knee pain and three-day history of right great toe pain. Denies any injury. 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 He states right knee is aching with some swelling. He does not know what could've caused the right great toe pain, which is very tender to touch. He denies any erythema, increased warmth, swelling of the toe. He has not taken anything for the pain. He states pain is constant, exacerbated by movement. He does not have a personal history of gout, but states it runs in the family. He is not currently taking his HCTZ. No recent beer, deli meat intake. Patient denies chest pain, shortness of breath, weakness, dizziness, blurry vision, headache.      Past Medical History:  Diagnosis Date  . Hx of adenomatous colonic polyps 06/12/2014  . Hyperlipidemia    no meds   . Hypertension     Patient Active Problem List   Diagnosis Date Noted  . Chest pain at rest 08/27/2014  . Hx of adenomatous colonic polyps 06/12/2014  . Hyperlipidemia 04/10/2010  . Hypertension 04/10/2010  . CHEST PAIN 04/10/2010  . ALLERGY 04/10/2010    Past Surgical History:  Procedure Laterality Date  . COLONOSCOPY    . ROTATOR CUFF REPAIR     right       Home Medications    Prior to Admission medications   Medication Sig Start Date End Date Taking? Authorizing Provider  naproxen (NAPROSYN) 500 MG tablet Take 1 tablet (500 mg total) by mouth 2 (two) times daily. 10/07/16 10/22/16  Ok Edwards, PA-C  terbinafine (LAMISIL) 250 MG tablet Take 1 tablet (250 mg total) by  mouth daily. 11/01/15   Hyatt, Max T, DPM  terbinafine (LAMISIL) 250 MG tablet Take 1 tablet (250 mg total) by mouth daily. 12/04/15   Hyatt, Max T, DPM    Family History Family History  Problem Relation Age of Onset  . Prostate cancer Maternal Uncle   . Colon cancer Neg Hx   . Esophageal cancer Neg Hx   . Rectal cancer Neg Hx   . Stomach cancer Neg Hx     Social History Social History  Substance Use Topics  . Smoking status: Never Smoker  . Smokeless tobacco: Never Used  . Alcohol use No     Allergies   Patient has no known allergies.   Review of Systems Review of Systems  Reason unable to perform ROS: See HPI as above.     Physical Exam Triage Vital Signs ED Triage Vitals [10/07/16 1138]  Enc Vitals Group     BP (!) 161/105     Pulse Rate 75     Resp 16     Temp 98.3 F (36.8 C)     Temp Source Oral     SpO2 96 %     Weight 290 lb (131.5 kg)     Height 6\' 4"  (1.93 m)     Head Circumference      Peak Flow  Pain Score 6     Pain Loc      Pain Edu?      Excl. in [place]?    No data found.   Updated Vital Signs BP (!) 161/105   Pulse 75   Temp 98.3 F (36.8 C) (Oral)   Resp 16   Ht 6\' 4"  (1.93 m)   Wt 290 lb (131.5 kg)   SpO2 96%   BMI 35.30 kg/m      Physical Exam  Constitutional: He is oriented to person, place, and time. He appears well-developed and well-nourished. No distress.  HENT:  Head: Normocephalic and atraumatic.  Eyes: Pupils are equal, round, and reactive to light. Conjunctivae and EOM are normal.  Cardiovascular: Normal rate, regular rhythm and normal heart sounds.  Exam reveals no gallop and no friction rub.   No murmur heard. Pulmonary/Chest: Effort normal and breath sounds normal. He has no wheezes. He has no rales.  Musculoskeletal:  No significant swelling, erythema, warmth noted to bilateral knees. No significant tenderness on palpation of bilateral knees. Full range of motion, strength normal and equal bilaterally.  Sensation intact and equal bilaterally.  No tenderness to palpation of bilateral ankles, full range of motion, strength normal and equal bilaterally. Mild swelling with erythema of the right great toe at the MTP. No significant increase in warmth. Tenderness on palpation to great toe MTP. Full range of motion though with pain. Sensation intact and equal bilaterally. Pedal pulses 2+ and equal bilaterally.  Neurological: He is alert and oriented to person, place, and time.     UC Treatments / Results  Labs (all labs ordered are listed, but only abnormal results are displayed) Labs Reviewed - No data to display  EKG  EKG Interpretation None       Radiology No results found.  Procedures Procedures (including critical care time)  Medications Ordered in UC Medications - No data to display   Initial Impression / Assessment and Plan / UC Course  I have reviewed the triage vital signs and the nursing notes.  Pertinent labs & imaging results that were available during my care of the patient were reviewed by me and considered in my medical decision making (see chart for details).    Right great toe pain ?gout vs tendon inflammation. Naproxen as directed. Knee sleeve for activity. Ice compress as needed. Discussed shoe insoles for better support during work. Discussed with patient to follow up with PCP for gout workup as needed. Patient with elevated blood pressure, denies chest pain, shortness of breath, weakness, dizziness, headache, blurry vision. DASH diet instructions given, patient to follow up with PCP for further evaluation and management. Return precautions given.   Final Clinical Impressions(s) / UC Diagnoses   Final diagnoses:  Acute pain of right knee  Pain of toe of right foot    New Prescriptions Discharge Medication List as of 10/07/2016 12:13 PM    START taking these medications   Details  naproxen (NAPROSYN) 500 MG tablet Take 1 tablet (500 mg total) by mouth 2  (two) times daily., Starting Tue 10/07/2016, Until Wed 10/22/2016, Normal          Bretta Fees V, PA-C 10/07/16 1225

## 2017-02-18 IMAGING — DX DG FINGER MIDDLE 2+V*R*
3 series · 3 of 3 positions shown · non-contrast
Comparison: None.

CLINICAL DATA: Injury to right middle finger while scooting back in
chair. Status post dislocation and reduction of right third finger,
with persistent pain. Initial encounter.

EXAM:
RIGHT MIDDLE FINGER 2+V

[finger ap]
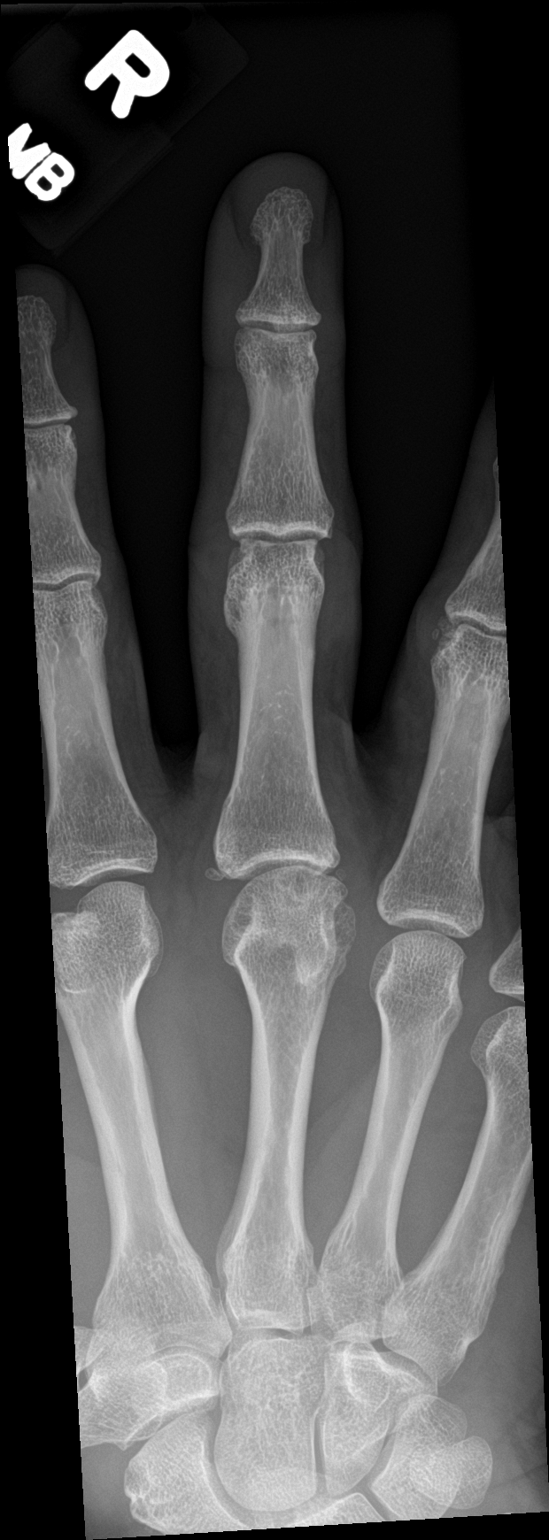

[finger obl]
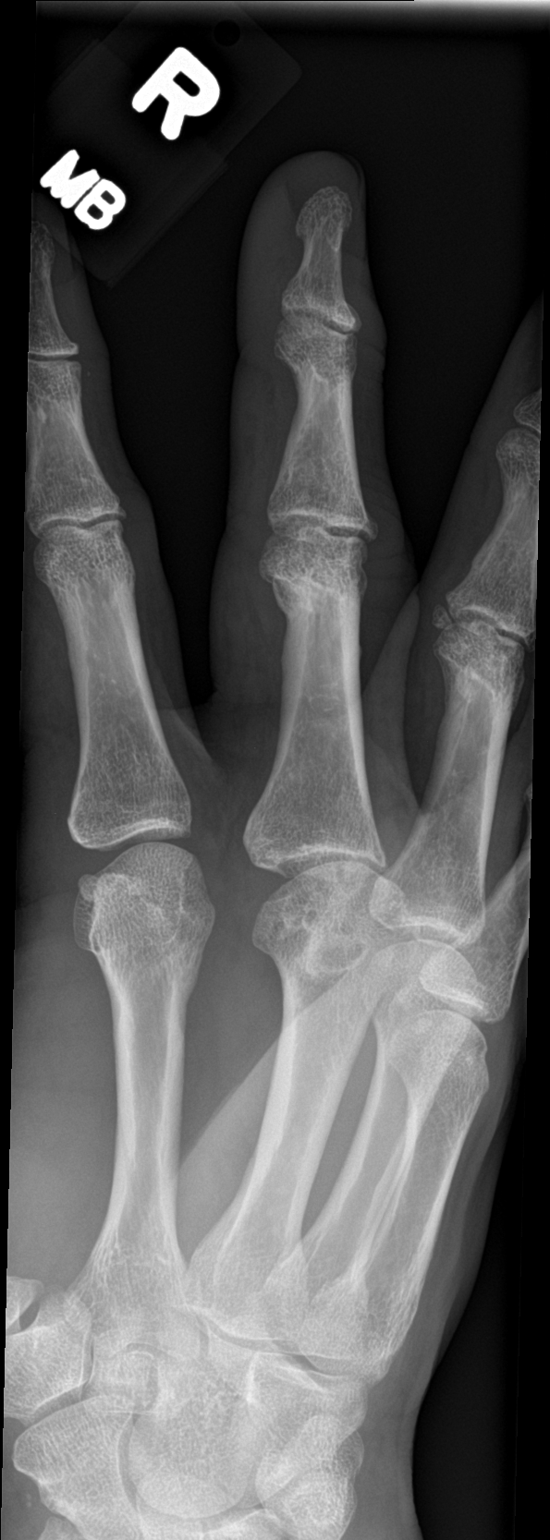

[finger lat]
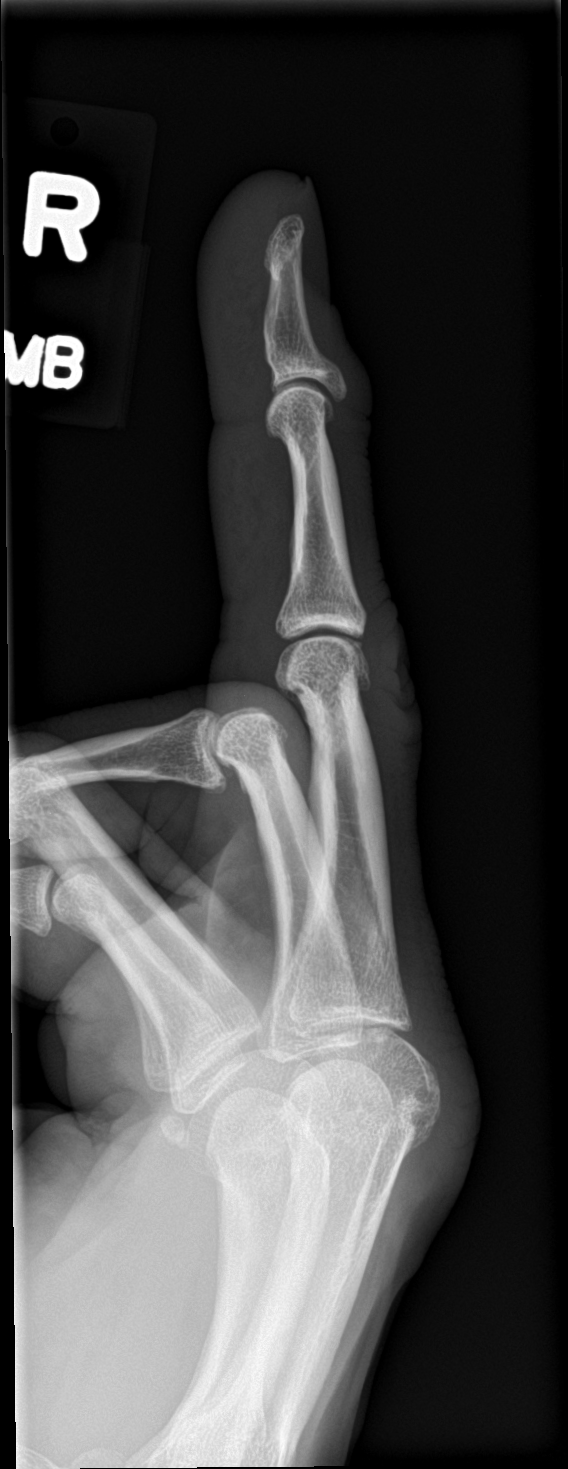

[3 of 3 positions shown; findings below may reference images not displayed]

FINDINGS: There is no evidence of dislocation at this time. Degenerative
change is noted at the distal third metacarpal, with mild joint
space narrowing and slight cortical irregularity. Mild osteophyte
formation is noted about the third proximal and distal
interphalangeal joints. Mild soft tissue swelling is noted about the
third digit. There is no evidence of fracture. Minimal degenerative
change is noted at the fourth proximal interphalangeal joint.
IMPRESSION: 1. No evidence of fracture or dislocation at this time.
2. Degenerative change at the distal third metacarpal, with mild
joint space narrowing and slight cortical irregularity.
3. Mild osteophyte formation noted about the third proximal and
distal interphalangeal joints, with mild soft tissue swelling about
the third digit.

## 2017-03-12 ENCOUNTER — Ambulatory Visit: Payer: Managed Care, Other (non HMO) | Admitting: Family Medicine

## 2017-03-12 ENCOUNTER — Ambulatory Visit: Payer: Self-pay | Admitting: *Deleted

## 2017-03-12 NOTE — Telephone Encounter (Addendum)
Pt called complaining of left arm from forearm to the thumb is having numbness for the past 3 days; he also says for the past couple of weeks he has been having pain in left neck radiating to shoulder to arm; per nurse triage recommendation for pt to see physician within 4 hours; pt already has appointment with Cindee Lame at [facility] 03/12/17 at 1420 per [facility]; pt verbalizes understanding; pt would like to keep this appointment as opposed to being seen earlier per triage recommendations; pt verbalizes understanding; pt also would like to establish care with this provider  Reason for Disposition . Neck pain  (and neurologic deficit)  Answer Assessment - Initial Assessment Questions 1. SYMPTOM: "What is the main symptom you are concerned about?" (e.g., weakness, numbness)     Numbness in left arm 2. ONSET: "When did this start?" (minutes, hours, days; while sleeping)     3 days 3. LAST NORMAL: "When was the last time you were normal (no symptoms)?"    Monday 03/09/17 4. PATTERN "Does this come and go, or has it been constant since it started?"  "Is it present now?"     Constant, present now 5. CARDIAC SYMPTOMS: "Have you had any of the following symptoms: chest pain, difficulty breathing, palpitations?"     no 6. NEUROLOGIC SYMPTOMS: "Have you had any of the following symptoms: headache, dizziness, vision loss, double vision, changes in speech, unsteady on your feet?"     no 7. OTHER SYMPTOMS: "Do you have any other symptoms?"     Pain in left shoulder 8. PREGNANCY: "Is there any chance you are pregnant?" "When was your last menstrual period?"     n/a  Protocols used: NEUROLOGIC DEFICIT-A-AH

## 2017-03-12 NOTE — Telephone Encounter (Signed)
  Reason for Disposition . [1] Numbness (i.e., loss of sensation) of the face, arm / hand, or leg / foot on one side of the body AND [2] gradual onset (e.g., days to weeks) AND [3] present now  Protocols used: NEUROLOGIC DEFICIT-A-AH

## 2017-03-13 ENCOUNTER — Other Ambulatory Visit: Payer: Self-pay

## 2017-03-13 ENCOUNTER — Ambulatory Visit: Payer: BC Managed Care – PPO | Admitting: Emergency Medicine

## 2017-03-13 ENCOUNTER — Encounter: Payer: Self-pay | Admitting: Emergency Medicine

## 2017-03-13 VITALS — BP 140/82 | HR 83 | Temp 98.1°F | Resp 16 | Ht 74.5 in | Wt 287.2 lb

## 2017-03-13 DIAGNOSIS — Z13228 Encounter for screening for other metabolic disorders: Secondary | ICD-10-CM

## 2017-03-13 DIAGNOSIS — Z130 Encounter for screening for diseases of the blood and blood-forming organs and certain disorders involving the immune mechanism: Secondary | ICD-10-CM

## 2017-03-13 DIAGNOSIS — R200 Anesthesia of skin: Secondary | ICD-10-CM | POA: Diagnosis not present

## 2017-03-13 DIAGNOSIS — Z1329 Encounter for screening for other suspected endocrine disorder: Secondary | ICD-10-CM

## 2017-03-13 DIAGNOSIS — Z1321 Encounter for screening for nutritional disorder: Secondary | ICD-10-CM | POA: Diagnosis not present

## 2017-03-13 DIAGNOSIS — M5412 Radiculopathy, cervical region: Secondary | ICD-10-CM | POA: Insufficient documentation

## 2017-03-13 MED ORDER — CYCLOBENZAPRINE HCL 10 MG PO TABS: 10.0000 mg | ORAL_TABLET | Freq: Every day | ORAL | 1 refills | Status: DC

## 2017-03-13 MED ORDER — DICLOFENAC SODIUM 75 MG PO TBEC: 75.0000 mg | DELAYED_RELEASE_TABLET | Freq: Two times a day (BID) | ORAL | 0 refills | Status: AC

## 2017-03-13 NOTE — Progress Notes (Signed)
Glenn Oliver 52 y.o.   Chief Complaint  Patient presents with  . Establish Care  . Numbness    LEFT ARM  x 4 days    HISTORY OF PRESENT ILLNESS: This is a 52 y.o. male complaining of numbness to the left arm for the past 4 days.  Denies any headaches, syncope, visual problems, chest pain, difficulty breathing, nausea vomiting, fever or chills, left arm weakness, or any other significant symptomatology.  HPI   Prior to Admission medications   Medication Sig Start Date End Date Taking? Authorizing Provider  terbinafine (LAMISIL) 250 MG tablet Take 1 tablet (250 mg total) by mouth daily. Patient not taking: Reported on 03/13/2017 11/01/15   Hyatt, Max T, DPM  terbinafine (LAMISIL) 250 MG tablet Take 1 tablet (250 mg total) by mouth daily. Patient not taking: Reported on 03/13/2017 12/04/15   Garrel Ridgel, DPM    No Known Allergies  Patient Active Problem List   Diagnosis Date Noted  . Chest pain at rest 08/27/2014  . Hx of adenomatous colonic polyps 06/12/2014  . Hyperlipidemia 04/10/2010  . Hypertension 04/10/2010  . CHEST PAIN 04/10/2010  . ALLERGY 04/10/2010    Past Medical History:  Diagnosis Date  . Hx of adenomatous colonic polyps 06/12/2014  . Hyperlipidemia    no meds   . Hypertension     Past Surgical History:  Procedure Laterality Date  . COLONOSCOPY    . ROTATOR CUFF REPAIR     right    Social History   Socioeconomic History  . Marital status: Legally Separated    Spouse name: Not on file  . Number of children: Not on file  . Years of education: Not on file  . Highest education level: Not on file  Social Needs  . Financial resource strain: Not on file  . Food insecurity - worry: Not on file  . Food insecurity - inability: Not on file  . Transportation needs - medical: Not on file  . Transportation needs - non-medical: Not on file  Occupational History  . Not on file  Tobacco Use  . Smoking status: Never Smoker  . Smokeless tobacco: Never Used   Substance and Sexual Activity  . Alcohol use: No  . Drug use: No  . Sexual activity: Not on file  Other Topics Concern  . Not on file  Social History Narrative  . Not on file    Family History  Problem Relation Age of Onset  . Prostate cancer Maternal Uncle   . Colon cancer Neg Hx   . Esophageal cancer Neg Hx   . Rectal cancer Neg Hx   . Stomach cancer Neg Hx      Review of Systems  Constitutional: Negative.  Negative for chills, fever and malaise/fatigue.  HENT: Negative.  Negative for congestion, nosebleeds and sore throat.   Eyes: Negative.   Respiratory: Negative.  Negative for cough, hemoptysis and shortness of breath.   Cardiovascular: Negative.  Negative for chest pain, palpitations, claudication and leg swelling.  Gastrointestinal: Negative.  Negative for abdominal pain, blood in stool, diarrhea, heartburn, nausea and vomiting.  Genitourinary: Negative for dysuria and hematuria.  Musculoskeletal: Positive for neck pain. Negative for joint pain and myalgias.  Skin: Negative.  Negative for rash.  Neurological: Negative.  Negative for dizziness and headaches.  Endo/Heme/Allergies: Negative.   All other systems reviewed and are negative.   Vitals:   03/13/17 1446  BP: (!) 152/92  Pulse: 83  Resp: 16  Temp: 98.1 F (36.7 C)  SpO2: 97%    Physical Exam  Constitutional: He is oriented to person, place, and time. He appears well-developed and well-nourished.  HENT:  Head: Normocephalic and atraumatic.  Nose: Nose normal.  Mouth/Throat: Oropharynx is clear and moist. No oropharyngeal exudate.  Eyes: Conjunctivae and EOM are normal. Pupils are equal, round, and reactive to light.  Neck: Normal range of motion. Neck supple. No JVD present. No thyromegaly present.  Positive tenderness to left upper trapezius.  Cardiovascular: Normal rate, regular rhythm and normal heart sounds.  Pulmonary/Chest: Effort normal and breath sounds normal.  Abdominal: Soft. He  exhibits no distension. There is no tenderness.  Musculoskeletal: Normal range of motion.  Lymphadenopathy:    He has no cervical adenopathy.  Neurological: He is alert and oriented to person, place, and time. He displays normal reflexes. No cranial nerve deficit or sensory deficit. He exhibits normal muscle tone. Coordination normal.  Skin: Skin is warm and dry. Capillary refill takes less than 2 seconds. No rash noted.  Psychiatric: He has a normal mood and affect. His behavior is normal.  Vitals reviewed.  NSR with VR88 No acute ischemic changes PR152 QRS96 compared with 08/27/2014 no changes.  A total of 30 minutes was spent in the room with the patient, greater than 50% of which was in counseling/coordination of care.  ASSESSMENT & PLAN: Glenn Oliver was seen today for establish care and numbness.  Diagnoses and all orders for this visit:  Left arm numbness -     EKG 12-Lead -     CBC with Differential/Platelet -     Comprehensive metabolic panel -     cyclobenzaprine (FLEXERIL) 10 MG tablet; Take 1 tablet (10 mg total) by mouth at bedtime. -     diclofenac (VOLTAREN) 75 MG EC tablet; Take 1 tablet (75 mg total) by mouth 2 (two) times daily for 5 days.  Cervical radiculopathy -     cyclobenzaprine (FLEXERIL) 10 MG tablet; Take 1 tablet (10 mg total) by mouth at bedtime. -     diclofenac (VOLTAREN) 75 MG EC tablet; Take 1 tablet (75 mg total) by mouth 2 (two) times daily for 5 days.  Screening for endocrine, nutritional, metabolic and immunity disorder -     Hemoglobin A1c    Patient Instructions       IF you received an x-ray today, you will receive an invoice from [facility]. Please contact [facility] at [phone] with questions or concerns regarding your invoice.   IF you received labwork today, you will receive an invoice from [facility]. Please contact [facility] at [phone] with questions or concerns regarding your invoice.   Our billing staff  will not be able to assist you with questions regarding bills from these companies.  You will be contacted with the lab results as soon as they are available. The fastest way to get your results is to activate your My Chart account. Instructions are located on the last page of this paperwork. If you have not heard from [place] regarding the results in 2 weeks, please contact this office.     Cervical Radiculopathy Cervical radiculopathy means that a nerve in the neck is pinched or bruised. This can cause pain or loss of feeling (numbness) that runs from your neck to your arm and fingers. Follow these instructions at home: Managing pain  Take over-the-counter and prescription medicines only as told by your doctor.  If directed, put ice on the injured or painful area. ?  Put ice in a plastic bag. ? Place a towel between your skin and the bag. ? Leave the ice on for 20 minutes, 2-3 times per day.  If ice does not help, you can try using heat. Take a warm shower or warm bath, or use a heat pack as told by your doctor.  You may try a gentle neck and shoulder massage. Activity  Rest as needed. Follow instructions from your doctor about any activities to avoid.  Do exercises as told by your doctor or physical therapist. General instructions  If you were given a soft collar, wear it as told by your doctor.  Use a flat pillow when you sleep.  Keep all follow-up visits as told by your doctor. This is important. Contact a doctor if:  Your condition does not improve with treatment. Get help right away if:  Your pain gets worse and is not controlled with medicine.  You lose feeling or feel weak in your hand, arm, face, or leg.  You have a fever.  You have a stiff neck.  You cannot control when you poop or pee (have incontinence).  You have trouble with walking, balance, or talking. This information is not intended to replace advice given to you by your health care provider. Make sure  you discuss any questions you have with your health care provider. Document Released: 01/16/2011 Document Revised: 07/05/2015 Document Reviewed: 03/23/2014 Elsevier Interactive Patient Education  2018 [employer].      Agustina Caroli, MD [facility]

## 2017-03-13 NOTE — Patient Instructions (Addendum)
     IF you received an x-ray today, you will receive an invoice from [facility]. Please contact [facility] at [phone] with questions or concerns regarding your invoice.   IF you received labwork today, you will receive an invoice from [facility]. Please contact [facility] at [phone] with questions or concerns regarding your invoice.   Our billing staff will not be able to assist you with questions regarding bills from these companies.  You will be contacted with the lab results as soon as they are available. The fastest way to get your results is to activate your My Chart account. Instructions are located on the last page of this paperwork. If you have not heard from us regarding the results in 2 weeks, please contact this office.     Cervical Radiculopathy Cervical radiculopathy means that a nerve in the neck is pinched or bruised. This can cause pain or loss of feeling (numbness) that runs from your neck to your arm and fingers. Follow these instructions at home: Managing pain  Take over-the-counter and prescription medicines only as told by your doctor.  If directed, put ice on the injured or painful area. ? Put ice in a plastic bag. ? Place a towel between your skin and the bag. ? Leave the ice on for 20 minutes, 2-3 times per day.  If ice does not help, you can try using heat. Take a warm shower or warm bath, or use a heat pack as told by your doctor.  You may try a gentle neck and shoulder massage. Activity  Rest as needed. Follow instructions from your doctor about any activities to avoid.  Do exercises as told by your doctor or physical therapist. General instructions  If you were given a soft collar, wear it as told by your doctor.  Use a flat pillow when you sleep.  Keep all follow-up visits as told by your doctor. This is important. Contact a doctor if:  Your condition does not improve with treatment. Get help right away if:  Your  pain gets worse and is not controlled with medicine.  You lose feeling or feel weak in your hand, arm, face, or leg.  You have a fever.  You have a stiff neck.  You cannot control when you poop or pee (have incontinence).  You have trouble with walking, balance, or talking. This information is not intended to replace advice given to you by your health care provider. Make sure you discuss any questions you have with your health care provider. Document Released: 01/16/2011 Document Revised: 07/05/2015 Document Reviewed: 03/23/2014 Elsevier Interactive Patient Education  2018 [employer]  

## 2017-03-14 LAB — COMPREHENSIVE METABOLIC PANEL
ALT: 21 IU/L (ref 0–44)
AST: 20 IU/L (ref 0–40)
Albumin/Globulin Ratio: 1.5 (ref 1.2–2.2)
Albumin: 4.1 g/dL (ref 3.5–5.5)
Alkaline Phosphatase: 88 IU/L (ref 39–117)
BUN/Creatinine Ratio: 11 (ref 9–20)
BUN: 14 mg/dL (ref 6–24)
Bilirubin Total: 0.4 mg/dL (ref 0.0–1.2)
CO2: 22 mmol/L (ref 20–29)
Calcium: 8.9 mg/dL (ref 8.7–10.2)
Chloride: 105 mmol/L (ref 96–106)
Creatinine, Ser: 1.29 mg/dL — ABNORMAL HIGH (ref 0.76–1.27)
GFR calc Af Amer: 73 mL/min/{1.73_m2} (ref >59)
GFR calc non Af Amer: 63 mL/min/{1.73_m2} (ref >59)
Globulin, Total: 2.7 g/dL (ref 1.5–4.5)
Glucose: 123 mg/dL — ABNORMAL HIGH (ref 65–99)
Potassium: 3.5 mmol/L (ref 3.5–5.2)
Sodium: 144 mmol/L (ref 134–144)
Total Protein: 6.8 g/dL (ref 6.0–8.5)

## 2017-03-14 LAB — CBC WITH DIFFERENTIAL/PLATELET
Basophils Absolute: 0 10*3/uL (ref 0.0–0.2)
Basos: 1 %
EOS (ABSOLUTE): 0.1 10*3/uL (ref 0.0–0.4)
Eos: 2 %
Hematocrit: 44.7 % (ref 37.5–51.0)
Hemoglobin: 15.3 g/dL (ref 13.0–17.7)
Immature Grans (Abs): 0 10*3/uL (ref 0.0–0.1)
Immature Granulocytes: 0 %
Lymphocytes Absolute: 1.1 10*3/uL (ref 0.7–3.1)
Lymphs: 30 %
MCH: 29.5 pg (ref 26.6–33.0)
MCHC: 34.2 g/dL (ref 31.5–35.7)
MCV: 86 fL (ref 79–97)
Monocytes Absolute: 0.5 10*3/uL (ref 0.1–0.9)
Monocytes: 13 %
Neutrophils Absolute: 2 10*3/uL (ref 1.4–7.0)
Neutrophils: 54 %
Platelets: 214 10*3/uL (ref 150–379)
RBC: 5.18 x10E6/uL (ref 4.14–5.80)
RDW: 14.4 % (ref 12.3–15.4)
WBC: 3.6 10*3/uL (ref 3.4–10.8)

## 2017-03-14 LAB — HEMOGLOBIN A1C
Est. average glucose Bld gHb Est-mCnc: 131 mg/dL
Hgb A1c MFr Bld: 6.2 % — ABNORMAL HIGH (ref 4.8–5.6)

## 2017-03-17 ENCOUNTER — Encounter: Payer: Self-pay | Admitting: *Deleted

## 2017-03-27 ENCOUNTER — Other Ambulatory Visit: Payer: Self-pay

## 2017-03-27 ENCOUNTER — Ambulatory Visit: Payer: BC Managed Care – PPO | Admitting: Emergency Medicine

## 2017-03-27 ENCOUNTER — Encounter: Payer: Self-pay | Admitting: Emergency Medicine

## 2017-03-27 VITALS — BP 160/120 | HR 83 | Temp 98.1°F | Resp 16 | Ht 74.5 in | Wt 288.6 lb

## 2017-03-27 DIAGNOSIS — R200 Anesthesia of skin: Secondary | ICD-10-CM

## 2017-03-27 DIAGNOSIS — M5412 Radiculopathy, cervical region: Secondary | ICD-10-CM | POA: Diagnosis not present

## 2017-03-27 DIAGNOSIS — I10 Essential (primary) hypertension: Secondary | ICD-10-CM | POA: Diagnosis not present

## 2017-03-27 DIAGNOSIS — M542 Cervicalgia: Secondary | ICD-10-CM

## 2017-03-27 MED ORDER — AMLODIPINE BESYLATE 10 MG PO TABS: 10.0000 mg | ORAL_TABLET | Freq: Every day | ORAL | 3 refills | Status: AC

## 2017-03-27 NOTE — Progress Notes (Signed)
BP Readings from Last 3 Encounters:  03/27/17 (!) 176/122  03/13/17 140/82  10/07/16 (!) 161/105   Glenn Oliver 52 y.o.   Chief Complaint  Patient presents with  . Numbness left arm    f/u from 03/13/17  per pt is "better" but still having numbness going down to thumb"    HISTORY OF PRESENT ILLNESS: This is a 52 y.o. male seen by me about 2 weeks ago complaining of left arm numbness and diagnosed with a left cervical radiculopathy.  Started on Flexeril and Voltaren.  States he is about 20-25% better.  No new symptomatology.  During this visit also found to have elevated blood pressure.  Has a history of hypertension.  Presently off medications.  Medical records reviewed.  Labs done 2 weeks ago showed normal electrolytes, normal renal function, normal CBC.  A1c is a prediabetic level.  Results reviewed with patient.  Had a similar problem years ago on the right side.  Had right side shoulder surgery and paresthesias got better.  HPI   Prior to Admission medications   Medication Sig Start Date End Date Taking? Authorizing Provider  cyclobenzaprine (FLEXERIL) 10 MG tablet Take 1 tablet (10 mg total) by mouth at bedtime. 03/13/17  Yes Chip Canepa, Ines Bloomer, MD  amLODipine (NORVASC) 10 MG tablet Take 1 tablet (10 mg total) by mouth daily. 03/27/17   Horald Pollen, MD  terbinafine (LAMISIL) 250 MG tablet Take 1 tablet (250 mg total) by mouth daily. Patient not taking: Reported on 03/27/2017 11/01/15   Hyatt, Max T, DPM  terbinafine (LAMISIL) 250 MG tablet Take 1 tablet (250 mg total) by mouth daily. Patient not taking: Reported on 03/27/2017 12/04/15   Garrel Ridgel, DPM    No Known Allergies  Patient Active Problem List   Diagnosis Date Noted  . Left arm numbness 03/13/2017  . Cervical radiculopathy 03/13/2017  . Screening for endocrine, nutritional, metabolic and immunity disorder 03/13/2017  . Chest pain at rest 08/27/2014  . Hx of adenomatous colonic polyps 06/12/2014  .  Hyperlipidemia 04/10/2010  . Hypertension 04/10/2010  . CHEST PAIN 04/10/2010  . ALLERGY 04/10/2010    Past Medical History:  Diagnosis Date  . Hx of adenomatous colonic polyps 06/12/2014  . Hyperlipidemia    no meds   . Hypertension     Past Surgical History:  Procedure Laterality Date  . COLONOSCOPY    . ROTATOR CUFF REPAIR     right    Social History   Socioeconomic History  . Marital status: Legally Separated    Spouse name: Not on file  . Number of children: Not on file  . Years of education: Not on file  . Highest education level: Not on file  Social Needs  . Financial resource strain: Not on file  . Food insecurity - worry: Not on file  . Food insecurity - inability: Not on file  . Transportation needs - medical: Not on file  . Transportation needs - non-medical: Not on file  Occupational History  . Not on file  Tobacco Use  . Smoking status: Never Smoker  . Smokeless tobacco: Never Used  Substance and Sexual Activity  . Alcohol use: No  . Drug use: No  . Sexual activity: Not on file  Other Topics Concern  . Not on file  Social History Narrative  . Not on file    Family History  Problem Relation Age of Onset  . Prostate cancer Maternal Uncle   . Colon  cancer Neg Hx   . Esophageal cancer Neg Hx   . Rectal cancer Neg Hx   . Stomach cancer Neg Hx      Review of Systems  Constitutional: Negative.  Negative for chills, fever and weight loss.  HENT: Negative.   Eyes: Negative.  Negative for blurred vision and double vision.  Respiratory: Negative.  Negative for cough and shortness of breath.   Cardiovascular: Negative.  Negative for chest pain, palpitations, claudication and leg swelling.  Gastrointestinal: Negative for abdominal pain, nausea and vomiting.  Genitourinary: Negative.  Negative for dysuria and hematuria.  Musculoskeletal: Negative.  Negative for back pain, myalgias and neck pain.  Skin: Negative.  Negative for rash.  Neurological:  Positive for sensory change (Left arm). Negative for dizziness, focal weakness and headaches.  Endo/Heme/Allergies: Negative.   All other systems reviewed and are negative.  Vitals:   03/27/17 1002 03/27/17 1036  BP: (!) 176/122 (!) 160/120  Pulse: 83   Resp: 16   Temp: 98.1 F (36.7 C)   SpO2: 98%      Physical Exam  Constitutional: He is oriented to person, place, and time. He appears well-developed and well-nourished.  HENT:  Head: Normocephalic and atraumatic.  Nose: Nose normal.  Mouth/Throat: Oropharynx is clear and moist.  Eyes: Conjunctivae and EOM are normal. Pupils are equal, round, and reactive to light.  Neck: Normal range of motion. Neck supple. No JVD present. No thyromegaly present.  Mild tenderness and spasm to left trapezius muscle.  Cardiovascular: Normal rate, regular rhythm and normal heart sounds.  Pulmonary/Chest: Effort normal and breath sounds normal.  Abdominal: Soft. Bowel sounds are normal. There is no tenderness.  Musculoskeletal: Normal range of motion. He exhibits no edema.  Lymphadenopathy:    He has no cervical adenopathy.  Neurological: He is alert and oriented to person, place, and time. He displays normal reflexes. No sensory deficit. He exhibits normal muscle tone. Coordination normal.  Skin: Skin is warm and dry. Capillary refill takes less than 2 seconds.  Psychiatric: He has a normal mood and affect. His behavior is normal.  Vitals reviewed.    ASSESSMENT & PLAN: Plan: We will schedule MRI of the cervical spine.  We will start amlodipine for uncontrolled blood pressure.  Advised on how to control prediabetes.  Will reassess again after MRI is done.  Cono was seen today for numbness left arm.  Diagnoses and all orders for this visit:  Left arm numbness  Cervical radiculopathy  Essential hypertension -     amLODipine (NORVASC) 10 MG tablet; Take 1 tablet (10 mg total) by mouth daily.  Cervicalgia -     MR Cervical Spine Wo  Contrast; Future    Patient Instructions       IF you received an x-ray today, you will receive an invoice from [facility]. Please contact [facility] at [phone] with questions or concerns regarding your invoice.   IF you received labwork today, you will receive an invoice from [place]. Please contact [facility] at [phone] with questions or concerns regarding your invoice.   Our billing staff will not be able to assist you with questions regarding bills from these companies.  You will be contacted with the lab results as soon as they are available. The fastest way to get your results is to activate your My Chart account. Instructions are located on the last page of this paperwork. If you have not heard from [place] regarding the results in 2 weeks, please contact this  office.     Cervical Radiculopathy Cervical radiculopathy means that a nerve in the neck is pinched or bruised. This can cause pain or loss of feeling (numbness) that runs from your neck to your arm and fingers. Follow these instructions at home: Managing pain  Take over-the-counter and prescription medicines only as told by your doctor.  If directed, put ice on the injured or painful area. ? Put ice in a plastic bag. ? Place a towel between your skin and the bag. ? Leave the ice on for 20 minutes, 2-3 times per day.  If ice does not help, you can try using heat. Take a warm shower or warm bath, or use a heat pack as told by your doctor.  You may try a gentle neck and shoulder massage. Activity  Rest as needed. Follow instructions from your doctor about any activities to avoid.  Do exercises as told by your doctor or physical therapist. General instructions  If you were given a soft collar, wear it as told by your doctor.  Use a flat pillow when you sleep.  Keep all follow-up visits as told by your doctor. This is important. Contact a doctor if:  Your condition does not improve  with treatment. Get help right away if:  Your pain gets worse and is not controlled with medicine.  You lose feeling or feel weak in your hand, arm, face, or leg.  You have a fever.  You have a stiff neck.  You cannot control when you poop or pee (have incontinence).  You have trouble with walking, balance, or talking. This information is not intended to replace advice given to you by your health care provider. Make sure you discuss any questions you have with your health care provider. Document Released: 01/16/2011 Document Revised: 07/05/2015 Document Reviewed: 03/23/2014 Elsevier Interactive Patient Education  2018 [employer].  Hypertension Hypertension, commonly called high blood pressure, is when the force of blood pumping through the arteries is too strong. The arteries are the blood vessels that carry blood from the heart throughout the body. Hypertension forces the heart to work harder to pump blood and may cause arteries to become narrow or stiff. Having untreated or uncontrolled hypertension can cause heart attacks, strokes, kidney disease, and other problems. A blood pressure reading consists of a higher number over a lower number. Ideally, your blood pressure should be below 120/80. The first ("top") number is called the systolic pressure. It is a measure of the pressure in your arteries as your heart beats. The second ("bottom") number is called the diastolic pressure. It is a measure of the pressure in your arteries as the heart relaxes. What are the causes? The cause of this condition is not known. What increases the risk? Some risk factors for high blood pressure are under your control. Others are not. Factors you can change  Smoking.  Having type 2 diabetes mellitus, high cholesterol, or both.  Not getting enough exercise or physical activity.  Being overweight.  Having too much fat, sugar, calories, or salt (sodium) in your diet.  Drinking too much  alcohol. Factors that are difficult or impossible to change  Having chronic kidney disease.  Having a family history of high blood pressure.  Age. Risk increases with age.  Race. You may be at higher risk if you are African-American.  Gender. Men are at higher risk than women before age 7. After age 40, women are at higher risk than men.  Having obstructive sleep apnea.  Stress. What are the signs or symptoms? Extremely high blood pressure (hypertensive crisis) may cause:  Headache.  Anxiety.  Shortness of breath.  Nosebleed.  Nausea and vomiting.  Severe chest pain.  Jerky movements you cannot control (seizures).  How is this diagnosed? This condition is diagnosed by measuring your blood pressure while you are seated, with your arm resting on a surface. The cuff of the blood pressure monitor will be placed directly against the skin of your upper arm at the level of your heart. It should be measured at least twice using the same arm. Certain conditions can cause a difference in blood pressure between your right and left arms. Certain factors can cause blood pressure readings to be lower or higher than normal (elevated) for a short period of time:  When your blood pressure is higher when you are in a health care provider's office than when you are at home, this is called white coat hypertension. Most people with this condition do not need medicines.  When your blood pressure is higher at home than when you are in a health care provider's office, this is called masked hypertension. Most people with this condition may need medicines to control blood pressure.  If you have a high blood pressure reading during one visit or you have normal blood pressure with other risk factors:  You may be asked to return on a different day to have your blood pressure checked again.  You may be asked to monitor your blood pressure at home for 1 week or longer.  If you are diagnosed with  hypertension, you may have other blood or imaging tests to help your health care provider understand your overall risk for other conditions. How is this treated? This condition is treated by making healthy lifestyle changes, such as eating healthy foods, exercising more, and reducing your alcohol intake. Your health care provider may prescribe medicine if lifestyle changes are not enough to get your blood pressure under control, and if:  Your systolic blood pressure is above 130.  Your diastolic blood pressure is above 80.  Your personal target blood pressure may vary depending on your medical conditions, your age, and other factors. Follow these instructions at home: Eating and drinking  Eat a diet that is high in fiber and potassium, and low in sodium, added sugar, and fat. An example eating plan is called the DASH (Dietary Approaches to Stop Hypertension) diet. To eat this way: ? Eat plenty of fresh fruits and vegetables. Try to fill half of your plate at each meal with fruits and vegetables. ? Eat whole grains, such as whole wheat pasta, brown rice, or whole grain bread. Fill about one quarter of your plate with whole grains. ? Eat or drink low-fat dairy products, such as skim milk or low-fat yogurt. ? Avoid fatty cuts of meat, processed or cured meats, and poultry with skin. Fill about one quarter of your plate with lean proteins, such as fish, chicken without skin, beans, eggs, and tofu. ? Avoid premade and processed foods. These tend to be higher in sodium, added sugar, and fat.  Reduce your daily sodium intake. Most people with hypertension should eat less than 1,500 mg of sodium a day.  Limit alcohol intake to no more than 1 drink a day for nonpregnant women and 2 drinks a day for men. One drink equals 12 oz of beer, 5 oz of wine, or 1 oz of hard liquor. Lifestyle  Work with your health care provider  to maintain a healthy body weight or to lose weight. Ask what an ideal weight is  for you.  Get at least 30 minutes of exercise that causes your heart to beat faster (aerobic exercise) most days of the week. Activities may include walking, swimming, or biking.  Include exercise to strengthen your muscles (resistance exercise), such as pilates or lifting weights, as part of your weekly exercise routine. Try to do these types of exercises for 30 minutes at least 3 days a week.  Do not use any products that contain nicotine or tobacco, such as cigarettes and e-cigarettes. If you need help quitting, ask your health care provider.  Monitor your blood pressure at home as told by your health care provider.  Keep all follow-up visits as told by your health care provider. This is important. Medicines  Take over-the-counter and prescription medicines only as told by your health care provider. Follow directions carefully. Blood pressure medicines must be taken as prescribed.  Do not skip doses of blood pressure medicine. Doing this puts you at risk for problems and can make the medicine less effective.  Ask your health care provider about side effects or reactions to medicines that you should watch for. Contact a health care provider if:  You think you are having a reaction to a medicine you are taking.  You have headaches that keep coming back (recurring).  You feel dizzy.  You have swelling in your ankles.  You have trouble with your vision. Get help right away if:  You develop a severe headache or confusion.  You have unusual weakness or numbness.  You feel faint.  You have severe pain in your chest or abdomen.  You vomit repeatedly.  You have trouble breathing. Summary  Hypertension is when the force of blood pumping through your arteries is too strong. If this condition is not controlled, it may put you at risk for serious complications.  Your personal target blood pressure may vary depending on your medical conditions, your age, and other factors. For most  people, a normal blood pressure is less than 120/80.  Hypertension is treated with lifestyle changes, medicines, or a combination of both. Lifestyle changes include weight loss, eating a healthy, low-sodium diet, exercising more, and limiting alcohol. This information is not intended to replace advice given to you by your health care provider. Make sure you discuss any questions you have with your health care provider. Document Released: 01/27/2005 Document Revised: 12/26/2015 Document Reviewed: 12/26/2015 Elsevier Interactive Patient Education  2018 [employer].  How to Take Your Blood Pressure You can take your blood pressure at home with a machine. You may need to check your blood pressure at home:  To check if you have high blood pressure (hypertension).  To check your blood pressure over time.  To make sure your blood pressure medicine is working.  Supplies needed: You will need a blood pressure machine, or monitor. You can buy one at a drugstore or online. When choosing one:  Choose one with an arm cuff.  Choose one that wraps around your upper arm. Only one finger should fit between your arm and the cuff.  Do not choose one that measures your blood pressure from your wrist or finger.  Your doctor can suggest a monitor. How to prepare Avoid these things for 30 minutes before checking your blood pressure:  Drinking caffeine.  Drinking alcohol.  Eating.  Smoking.  Exercising.  Five minutes before checking your blood pressure:  Pee.  Sit in a dining chair. Avoid sitting in a soft couch or armchair.  Be quiet. Do not talk.  How to take your blood pressure Follow the instructions that came with your machine. If you have a digital blood pressure monitor, these may be the instructions: 1. Sit up straight. 2. Place your feet on the floor. Do not cross your ankles or legs. 3. Rest your left arm at the level of your heart. You may rest it on a table, desk, or  chair. 4. Pull up your shirt sleeve. 5. Wrap the blood pressure cuff around the upper part of your left arm. The cuff should be 1 inch (2.5 cm) above your elbow. It is best to wrap the cuff around bare skin. 6. Fit the cuff snugly around your arm. You should be able to place only one finger between the cuff and your arm. 7. Put the cord inside the groove of your elbow. 8. Press the power button. 9. Sit quietly while the cuff fills with air and loses air. 10. Write down the numbers on the screen. 11. Wait 2-3 minutes and then repeat steps 1-10.  What do the numbers mean? Two numbers make up your blood pressure. The first number is called systolic pressure. The second is called diastolic pressure. An example of a blood pressure reading is "120 over 80" (or 120/80). If you are an adult and do not have a medical condition, use this guide to find out if your blood pressure is normal: Normal  First number: below 120.  Second number: below 80. Elevated  First number: 120-129.  Second number: below 80. Hypertension stage 1  First number: 130-139.  Second number: 80-89. Hypertension stage 2  First number: 140 or above.  Second number: 44 or above. Your blood pressure is above normal even if only the top or bottom number is above normal. Follow these instructions at home:  Check your blood pressure as often as your doctor tells you to.  Take your monitor to your next doctor's appointment. Your doctor will: ? Make sure you are using it correctly. ? Make sure it is working right.  Make sure you understand what your blood pressure numbers should be.  Tell your doctor if your medicines are causing side effects. Contact a doctor if:  Your blood pressure keeps being high. Get help right away if:  Your first blood pressure number is higher than 180.  Your second blood pressure number is higher than 120. This information is not intended to replace advice given to you by your health  care provider. Make sure you discuss any questions you have with your health care provider. Document Released: 01/10/2008 Document Revised: 12/26/2015 Document Reviewed: 07/06/2015 Elsevier Interactive Patient Education  2018 [employer]     Agustina Caroli, MD [facility]

## 2017-03-27 NOTE — Patient Instructions (Addendum)
IF you received an x-ray today, you will receive an invoice from [facility]. Please contact [facility] at [phone] with questions or concerns regarding your invoice.   IF you received labwork today, you will receive an invoice from [facility]. Please contact [facility] at [phone] with questions or concerns regarding your invoice.   Our billing staff will not be able to assist you with questions regarding bills from these companies.  You will be contacted with the lab results as soon as they are available. The fastest way to get your results is to activate your My Chart account. Instructions are located on the last page of this paperwork. If you have not heard from [place] regarding the results in 2 weeks, please contact this office.     Cervical Radiculopathy Cervical radiculopathy means that a nerve in the neck is pinched or bruised. This can cause pain or loss of feeling (numbness) that runs from your neck to your arm and fingers. Follow these instructions at home: Managing pain  Take over-the-counter and prescription medicines only as told by your doctor.  If directed, put ice on the injured or painful area. ? Put ice in a plastic bag. ? Place a towel between your skin and the bag. ? Leave the ice on for 20 minutes, 2-3 times per day.  If ice does not help, you can try using heat. Take a warm shower or warm bath, or use a heat pack as told by your doctor.  You may try a gentle neck and shoulder massage. Activity  Rest as needed. Follow instructions from your doctor about any activities to avoid.  Do exercises as told by your doctor or physical therapist. General instructions  If you were given a soft collar, wear it as told by your doctor.  Use a flat pillow when you sleep.  Keep all follow-up visits as told by your doctor. This is important. Contact a doctor if:  Your condition does not improve with treatment. Get help right away if:  Your  pain gets worse and is not controlled with medicine.  You lose feeling or feel weak in your hand, arm, face, or leg.  You have a fever.  You have a stiff neck.  You cannot control when you poop or pee (have incontinence).  You have trouble with walking, balance, or talking. This information is not intended to replace advice given to you by your health care provider. Make sure you discuss any questions you have with your health care provider. Document Released: 01/16/2011 Document Revised: 07/05/2015 Document Reviewed: 03/23/2014 Elsevier Interactive Patient Education  2018 [employer].  Hypertension Hypertension, commonly called high blood pressure, is when the force of blood pumping through the arteries is too strong. The arteries are the blood vessels that carry blood from the heart throughout the body. Hypertension forces the heart to work harder to pump blood and may cause arteries to become narrow or stiff. Having untreated or uncontrolled hypertension can cause heart attacks, strokes, kidney disease, and other problems. A blood pressure reading consists of a higher number over a lower number. Ideally, your blood pressure should be below 120/80. The first ("top") number is called the systolic pressure. It is a measure of the pressure in your arteries as your heart beats. The second ("bottom") number is called the diastolic pressure. It is a measure of the pressure in your arteries as the heart relaxes. What are the causes? The cause of this condition is not known. What  increases the risk? Some risk factors for high blood pressure are under your control. Others are not. Factors you can change  Smoking.  Having type 2 diabetes mellitus, high cholesterol, or both.  Not getting enough exercise or physical activity.  Being overweight.  Having too much fat, sugar, calories, or salt (sodium) in your diet.  Drinking too much alcohol. Factors that are difficult or impossible to  change  Having chronic kidney disease.  Having a family history of high blood pressure.  Age. Risk increases with age.  Race. You may be at higher risk if you are African-American.  Gender. Men are at higher risk than women before age 48. After age 78, women are at higher risk than men.  Having obstructive sleep apnea.  Stress. What are the signs or symptoms? Extremely high blood pressure (hypertensive crisis) may cause:  Headache.  Anxiety.  Shortness of breath.  Nosebleed.  Nausea and vomiting.  Severe chest pain.  Jerky movements you cannot control (seizures).  How is this diagnosed? This condition is diagnosed by measuring your blood pressure while you are seated, with your arm resting on a surface. The cuff of the blood pressure monitor will be placed directly against the skin of your upper arm at the level of your heart. It should be measured at least twice using the same arm. Certain conditions can cause a difference in blood pressure between your right and left arms. Certain factors can cause blood pressure readings to be lower or higher than normal (elevated) for a short period of time:  When your blood pressure is higher when you are in a health care provider's office than when you are at home, this is called white coat hypertension. Most people with this condition do not need medicines.  When your blood pressure is higher at home than when you are in a health care provider's office, this is called masked hypertension. Most people with this condition may need medicines to control blood pressure.  If you have a high blood pressure reading during one visit or you have normal blood pressure with other risk factors:  You may be asked to return on a different day to have your blood pressure checked again.  You may be asked to monitor your blood pressure at home for 1 week or longer.  If you are diagnosed with hypertension, you may have other blood or imaging tests  to help your health care provider understand your overall risk for other conditions. How is this treated? This condition is treated by making healthy lifestyle changes, such as eating healthy foods, exercising more, and reducing your alcohol intake. Your health care provider may prescribe medicine if lifestyle changes are not enough to get your blood pressure under control, and if:  Your systolic blood pressure is above 130.  Your diastolic blood pressure is above 80.  Your personal target blood pressure may vary depending on your medical conditions, your age, and other factors. Follow these instructions at home: Eating and drinking  Eat a diet that is high in fiber and potassium, and low in sodium, added sugar, and fat. An example eating plan is called the DASH (Dietary Approaches to Stop Hypertension) diet. To eat this way: ? Eat plenty of fresh fruits and vegetables. Try to fill half of your plate at each meal with fruits and vegetables. ? Eat whole grains, such as whole wheat pasta, brown rice, or whole grain bread. Fill about one quarter of your plate with whole grains. ?  Eat or drink low-fat dairy products, such as skim milk or low-fat yogurt. ? Avoid fatty cuts of meat, processed or cured meats, and poultry with skin. Fill about one quarter of your plate with lean proteins, such as fish, chicken without skin, beans, eggs, and tofu. ? Avoid premade and processed foods. These tend to be higher in sodium, added sugar, and fat.  Reduce your daily sodium intake. Most people with hypertension should eat less than 1,500 mg of sodium a day.  Limit alcohol intake to no more than 1 drink a day for nonpregnant women and 2 drinks a day for men. One drink equals 12 oz of beer, 5 oz of wine, or 1 oz of hard liquor. Lifestyle  Work with your health care provider to maintain a healthy body weight or to lose weight. Ask what an ideal weight is for you.  Get at least 30 minutes of exercise that  causes your heart to beat faster (aerobic exercise) most days of the week. Activities may include walking, swimming, or biking.  Include exercise to strengthen your muscles (resistance exercise), such as pilates or lifting weights, as part of your weekly exercise routine. Try to do these types of exercises for 30 minutes at least 3 days a week.  Do not use any products that contain nicotine or tobacco, such as cigarettes and e-cigarettes. If you need help quitting, ask your health care provider.  Monitor your blood pressure at home as told by your health care provider.  Keep all follow-up visits as told by your health care provider. This is important. Medicines  Take over-the-counter and prescription medicines only as told by your health care provider. Follow directions carefully. Blood pressure medicines must be taken as prescribed.  Do not skip doses of blood pressure medicine. Doing this puts you at risk for problems and can make the medicine less effective.  Ask your health care provider about side effects or reactions to medicines that you should watch for. Contact a health care provider if:  You think you are having a reaction to a medicine you are taking.  You have headaches that keep coming back (recurring).  You feel dizzy.  You have swelling in your ankles.  You have trouble with your vision. Get help right away if:  You develop a severe headache or confusion.  You have unusual weakness or numbness.  You feel faint.  You have severe pain in your chest or abdomen.  You vomit repeatedly.  You have trouble breathing. Summary  Hypertension is when the force of blood pumping through your arteries is too strong. If this condition is not controlled, it may put you at risk for serious complications.  Your personal target blood pressure may vary depending on your medical conditions, your age, and other factors. For most people, a normal blood pressure is less than  120/80.  Hypertension is treated with lifestyle changes, medicines, or a combination of both. Lifestyle changes include weight loss, eating a healthy, low-sodium diet, exercising more, and limiting alcohol. This information is not intended to replace advice given to you by your health care provider. Make sure you discuss any questions you have with your health care provider. Document Released: 01/27/2005 Document Revised: 12/26/2015 Document Reviewed: 12/26/2015 Elsevier Interactive Patient Education  2018 [employer].  How to Take Your Blood Pressure You can take your blood pressure at home with a machine. You may need to check your blood pressure at home:  To check if you have high  blood pressure (hypertension).  To check your blood pressure over time.  To make sure your blood pressure medicine is working.  Supplies needed: You will need a blood pressure machine, or monitor. You can buy one at a drugstore or online. When choosing one:  Choose one with an arm cuff.  Choose one that wraps around your upper arm. Only one finger should fit between your arm and the cuff.  Do not choose one that measures your blood pressure from your wrist or finger.  Your doctor can suggest a monitor. How to prepare Avoid these things for 30 minutes before checking your blood pressure:  Drinking caffeine.  Drinking alcohol.  Eating.  Smoking.  Exercising.  Five minutes before checking your blood pressure:  Pee.  Sit in a dining chair. Avoid sitting in a soft couch or armchair.  Be quiet. Do not talk.  How to take your blood pressure Follow the instructions that came with your machine. If you have a digital blood pressure monitor, these may be the instructions: 1. Sit up straight. 2. Place your feet on the floor. Do not cross your ankles or legs. 3. Rest your left arm at the level of your heart. You may rest it on a table, desk, or chair. 4. Pull up your shirt sleeve. 5. Wrap the  blood pressure cuff around the upper part of your left arm. The cuff should be 1 inch (2.5 cm) above your elbow. It is best to wrap the cuff around bare skin. 6. Fit the cuff snugly around your arm. You should be able to place only one finger between the cuff and your arm. 7. Put the cord inside the groove of your elbow. 8. Press the power button. 9. Sit quietly while the cuff fills with air and loses air. 10. Write down the numbers on the screen. 11. Wait 2-3 minutes and then repeat steps 1-10.  What do the numbers mean? Two numbers make up your blood pressure. The first number is called systolic pressure. The second is called diastolic pressure. An example of a blood pressure reading is "120 over 80" (or 120/80). If you are an adult and do not have a medical condition, use this guide to find out if your blood pressure is normal: Normal  First number: below 120.  Second number: below 80. Elevated  First number: 120-129.  Second number: below 80. Hypertension stage 1  First number: 130-139.  Second number: 80-89. Hypertension stage 2  First number: 140 or above.  Second number: 42 or above. Your blood pressure is above normal even if only the top or bottom number is above normal. Follow these instructions at home:  Check your blood pressure as often as your doctor tells you to.  Take your monitor to your next doctor's appointment. Your doctor will: ? Make sure you are using it correctly. ? Make sure it is working right.  Make sure you understand what your blood pressure numbers should be.  Tell your doctor if your medicines are causing side effects. Contact a doctor if:  Your blood pressure keeps being high. Get help right away if:  Your first blood pressure number is higher than 180.  Your second blood pressure number is higher than 120. This information is not intended to replace advice given to you by your health care provider. Make sure you discuss any questions  you have with your health care provider. Document Released: 01/10/2008 Document Revised: 12/26/2015 Document Reviewed: 07/06/2015 Elsevier Interactive Patient Education  2018 [employer] Inc[employer]  

## 2017-04-07 ENCOUNTER — Telehealth: Payer: Self-pay | Admitting: Emergency Medicine

## 2017-04-07 ENCOUNTER — Other Ambulatory Visit: Payer: Self-pay | Admitting: Emergency Medicine

## 2017-04-07 DIAGNOSIS — M5412 Radiculopathy, cervical region: Secondary | ICD-10-CM

## 2017-04-07 NOTE — Telephone Encounter (Signed)
Must follow up with Orthopedics first. Referral placed. Thanks.

## 2017-04-07 NOTE — Telephone Encounter (Signed)
Tried to get pt mri approved but insurance denied didn't see an xray imaging in chart.  Thanks

## 2017-04-07 NOTE — Telephone Encounter (Signed)
You can try and do a peer to peer to see if it can get approved aim# [phone]

## 2017-04-20 ENCOUNTER — Other Ambulatory Visit (INDEPENDENT_AMBULATORY_CARE_PROVIDER_SITE_OTHER): Payer: Self-pay | Admitting: Radiology

## 2017-04-20 ENCOUNTER — Ambulatory Visit (INDEPENDENT_AMBULATORY_CARE_PROVIDER_SITE_OTHER): Payer: Self-pay

## 2017-04-20 ENCOUNTER — Ambulatory Visit (INDEPENDENT_AMBULATORY_CARE_PROVIDER_SITE_OTHER): Payer: BC Managed Care – PPO | Admitting: Orthopaedic Surgery

## 2017-04-20 ENCOUNTER — Encounter (INDEPENDENT_AMBULATORY_CARE_PROVIDER_SITE_OTHER): Payer: Self-pay | Admitting: Orthopaedic Surgery

## 2017-04-20 VITALS — BP 154/109 | HR 82 | Resp 16 | Ht 77.0 in | Wt 282.0 lb

## 2017-04-20 DIAGNOSIS — M542 Cervicalgia: Secondary | ICD-10-CM

## 2017-04-20 DIAGNOSIS — M25512 Pain in left shoulder: Secondary | ICD-10-CM

## 2017-04-20 MED ORDER — METHYLPREDNISOLONE 4 MG PO TBPK: ORAL_TABLET | ORAL | 0 refills | Status: DC

## 2017-04-20 NOTE — Progress Notes (Signed)
Office Visit Note   Patient: Glenn Oliver           Date of Birth: November 24, 1965           MRN: [id number] Visit Date: 04/20/2017              Requested by: Horald Pollen, MD [place], [place] [address] PCP: Patient, No Pcp Per   Assessment & Plan: Visit Diagnoses:  1. Neck pain   2. Acute pain of left shoulder     Plan: degenerative arthritis cervical spine  With some  Pain re to the left upper extremity.Medrol Dosepak. Office 1 week  Follow-Up Instructions: Return in about 1 week (around 04/27/2017), or if symptoms worsen or fail to improve.   Orders:  Orders Placed This Encounter  Procedures  . XR Cervical Spine 2 or 3 views  . XR Shoulder Left   No orders of the defined types were placed in this encounter.     Procedures: No procedures performed   Clinical Data: No additional findings.   Subjective: Chief Complaint  Patient presents with  . Left Shoulder - Pain    MR Hunzeker IS 52 Y O M HERE FOR PAIN STARTING IN R SHOULDER THAT RADIATES DOWN TO THUMB. BEEN GOING ON 1 MONTH, NO INJECTIONS OR PT. NO XRAYS  . New Patient (Initial Visit)  no acute injury or trauma. Insidious onset of neck pain with referred discomfort  To left forearm and hand. No specific left shoulder pain. No referred pain to right upper extremity.  HPI  Review of Systems  Constitutional: Positive for fatigue. Negative for fever.  HENT: Negative for ear pain.   Eyes: Negative for pain.  Respiratory: Negative for cough and shortness of breath.   Gastrointestinal: Negative for blood in stool, constipation and diarrhea.  Genitourinary: Negative for dysuria.  Musculoskeletal: Positive for neck pain and neck stiffness. Negative for back pain.  Allergic/Immunologic: Negative for food allergies.  Neurological: Positive for weakness and numbness.  Psychiatric/Behavioral: Positive for sleep disturbance.     Objective: Vital Signs: BP (!) 154/109 (BP Location: Right Arm,  Patient Position: Sitting, Cuff Size: Normal)   Pulse 82   Resp 16   Ht 6\' 5"  (1.956 m)   Wt 282 lb (127.9 kg)   BMI 33.44 kg/m   Physical Exam  Ortho Examwake alert and oriented 3. No pain with range of motion left Shoulder. Negative impingement. Negative empty can testing. Neurologically intact. Mild pain with range of motion cervical spine.No neck masses. Deep tendon reflexes intact.  Specialty Comments:  No specialty comments available.  Imaging: Xr Cervical Spine 2 Or 3 Views  Result Date: 04/20/2017 ilms of the cervical spine were obtained in 2 projectioins. Straightening of the normal lordotic c Degenerative changes at C4-5 C5-6 No listhesis. No acute changes.  Xr Shoulder Left  Result Date: 04/20/2017 Films of the left shoulder were also obtained. Humeral head is centered about the glenoid. No ectopic calcification. Mild a-c joint DJD. Normal space between [facility] and acromium    PMFS History: Patient Active Problem List   Diagnosis Date Noted  . Left arm numbness 03/13/2017  . Cervical radiculopathy 03/13/2017  . Screening for endocrine, nutritional, metabolic and immunity disorder 03/13/2017  . Chest pain at rest 08/27/2014  . Hx of adenomatous colonic polyps 06/12/2014  . Hyperlipidemia 04/10/2010  . Hypertension 04/10/2010  . CHEST PAIN 04/10/2010  . ALLERGY 04/10/2010   Past Medical History:  Diagnosis Date  .  Hx of adenomatous colonic polyps 06/12/2014  . Hyperlipidemia    no meds   . Hypertension     Family History  Problem Relation Age of Onset  . Prostate cancer Maternal Uncle   . Hypertension Mother   . Diabetes Mother   . Heart disease Mother   . Heart disease Sister   . Diabetes Sister   . Hypertension Sister   . Cancer Sister   . Brain cancer Sister   . Colon cancer Neg Hx   . Esophageal cancer Neg Hx   . Rectal cancer Neg Hx   . Stomach cancer Neg Hx     Past Surgical History:  Procedure Laterality Date  . COLONOSCOPY    . ROTATOR CUFF  REPAIR     right   Social History   Occupational History  . Not on file  Tobacco Use  . Smoking status: Never Smoker  . Smokeless tobacco: Never Used  Substance and Sexual Activity  . Alcohol use: No  . Drug use: No  . Sexual activity: Not on file

## 2017-05-01 ENCOUNTER — Ambulatory Visit (INDEPENDENT_AMBULATORY_CARE_PROVIDER_SITE_OTHER): Payer: BC Managed Care – PPO | Admitting: Orthopaedic Surgery

## 2017-05-05 ENCOUNTER — Ambulatory Visit: Payer: BC Managed Care – PPO | Admitting: Physician Assistant

## 2017-05-05 ENCOUNTER — Encounter: Payer: Self-pay | Admitting: Physician Assistant

## 2017-05-05 ENCOUNTER — Other Ambulatory Visit: Payer: Self-pay

## 2017-05-05 VITALS — BP 130/82 | HR 96 | Temp 98.9°F | Resp 18 | Ht 77.0 in | Wt 290.8 lb

## 2017-05-05 DIAGNOSIS — J3489 Other specified disorders of nose and nasal sinuses: Secondary | ICD-10-CM

## 2017-05-05 DIAGNOSIS — R05 Cough: Secondary | ICD-10-CM

## 2017-05-05 DIAGNOSIS — R6889 Other general symptoms and signs: Secondary | ICD-10-CM | POA: Diagnosis not present

## 2017-05-05 DIAGNOSIS — R059 Cough, unspecified: Secondary | ICD-10-CM

## 2017-05-05 LAB — POC INFLUENZA A&B (BINAX/QUICKVUE)
Influenza A, POC: NEGATIVE
Influenza B, POC: NEGATIVE

## 2017-05-05 MED ORDER — BENZONATATE 100 MG PO CAPS: 100.0000 mg | ORAL_CAPSULE | Freq: Three times a day (TID) | ORAL | 0 refills | Status: DC | PRN

## 2017-05-05 MED ORDER — GUAIFENESIN ER 1200 MG PO TB12: 1.0000 | ORAL_TABLET | Freq: Two times a day (BID) | ORAL | 1 refills | Status: DC | PRN

## 2017-05-05 NOTE — Progress Notes (Signed)
Glenn Oliver  MRN: [id number] DOB: Jun 29, 1965  PCP: Patient, No Pcp Per  Subjective:  Pt is a 52 year old male who presents to clinic for cough x 2 days. Endorses lightheadedness, hot and cold chills, body aches and fatigue. 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 He is feeling better today compared to the past few days.  He has been taking NyQuil.   Review of Systems  Constitutional: Positive for chills and fatigue. Negative for diaphoresis and fever.  HENT: Positive for congestion. Negative for postnasal drip, rhinorrhea, sinus pressure, sinus pain, sneezing and sore throat.   Respiratory: Positive for cough. Negative for shortness of breath and wheezing.     Patient Active Problem List   Diagnosis Date Noted  . Left arm numbness 03/13/2017  . Cervical radiculopathy 03/13/2017  . Screening for endocrine, nutritional, metabolic and immunity disorder 03/13/2017  . Chest pain at rest 08/27/2014  . Hx of adenomatous colonic polyps 06/12/2014  . Hyperlipidemia 04/10/2010  . Hypertension 04/10/2010  . CHEST PAIN 04/10/2010  . ALLERGY 04/10/2010    Current Outpatient Medications on File Prior to Visit  Medication Sig Dispense Refill  . amLODipine (NORVASC) 10 MG tablet Take 1 tablet (10 mg total) by mouth daily. 90 tablet 3  . cyclobenzaprine (FLEXERIL) 10 MG tablet Take 1 tablet (10 mg total) by mouth at bedtime. (Patient not taking: Reported on 04/20/2017) 20 tablet 1  . methylPREDNISolone (MEDROL DOSEPAK) 4 MG TBPK tablet TAKE AS DIRECTED (Patient not taking: Reported on 05/05/2017) 21 tablet 0  . terbinafine (LAMISIL) 250 MG tablet Take 1 tablet (250 mg total) by mouth daily. (Patient not taking: Reported on 03/27/2017) 30 tablet 0  . terbinafine (LAMISIL) 250 MG tablet Take 1 tablet (250 mg total) by mouth daily. (Patient not taking: Reported on 03/27/2017) 90 tablet 0   No current facility-administered medications on file prior to visit.      No Known Allergies   Objective:  BP 130/82 (BP Location: Left Arm, Patient Position: Sitting, Cuff Size: Large)   Pulse 96   Temp 98.9 F (37.2 C) (Oral)   Resp 18   Ht 6\' 5"  (1.956 m)   Wt 290 lb 12.8 oz (131.9 kg)   SpO2 97%   BMI 34.48 kg/m   Physical Exam  Constitutional: He is oriented to person, place, and time and well-developed, well-nourished, and in no distress. No distress.  HENT:  Right Ear: Tympanic membrane normal.  Left Ear: Tympanic membrane normal.  Nose: Mucosal edema present. No rhinorrhea. Right sinus exhibits no maxillary sinus tenderness and no frontal sinus tenderness. Left sinus exhibits no maxillary sinus tenderness and no frontal sinus tenderness.  Mouth/Throat: Oropharynx is clear and moist and mucous membranes are normal.  Cardiovascular: Normal rate, regular rhythm and normal heart sounds.  Pulmonary/Chest: Effort normal and breath sounds normal. No respiratory distress. He has no wheezes. He has no rales.  Neurological: He is alert and oriented to person, place, and time. GCS score is 15.  Skin: Skin is warm and dry.  Psychiatric: Mood, memory, affect and judgment normal.  Vitals reviewed.  Results for orders placed or performed in visit on 05/05/17  POC Influenza A&B(BINAX/QUICKVUE)  Result Value Ref Range   Influenza A, POC Negative Negative   Influenza B, POC Negative Negative    Assessment and Plan :  1. Flu-like symptoms - POC Influenza A&B(BINAX/QUICKVUE) - Pt presents with flu-like symptoms x 2 days.  Negative flu. Vitals are stable. Plan to treat supportively. RTC in 5-7 days if no improvement.  2. Cough - benzonatate (TESSALON) 100 MG capsule; Take 1-2 capsules (100-200 mg total) by mouth 3 (three) times daily as needed for cough.  Dispense: 40 capsule; Refill: 0  3. Nasal drainage - Guaifenesin (MUCINEX MAXIMUM STRENGTH) 1200 MG TB12; Take 1 tablet (1,200 mg total) by mouth every 12 (twelve) hours as needed.  Dispense: 14 tablet;  Refill: 1   Whitney Terea Neubauer, PA-C  [facility] 05/05/2017 12:25 PM

## 2017-05-05 NOTE — Patient Instructions (Addendum)
Stay well hydrated. Get lost of rest. Wash your hands often.   -Foods that can help speed recovery: honey, garlic, chicken soup, elderberries, green tea.  -Supplements that can help speed recovery: vitamin C, zinc, elderberry extract, quercetin, ginseng, selenium -Supplement with prebiotics and probiotics.  Advil or ibuprofen for pain. Do not take Aspirin.  Drink enough water and fluids to keep your urine clear or pale yellow.  For sore throat try using a honey-based tea. Use 3 teaspoons of honey with juice squeezed from half lemon. Place shaved pieces of ginger into 1/2-1 cup of water and warm over stove top. Then mix the ingredients and repeat every 4 hours as needed.  Cough Syrup Recipe: Sweet Lemon & Honey Thyme  Ingredients a handful of fresh thyme sprigs   1 pint of water (2 cups)  1/2 cup honey (raw is best, but regular will do)  1/2 lemon chopped Instructions 1. Place the lemon in the pint jar and cover with the honey. The honey will macerate the lemons and draw out liquids which taste so delicious! 2. Meanwhile, toss the thyme leaves into a saucepan and cover them with the water. 3. Bring the water to a gentle simmer and reduce it to half, about a cup of tea. 4. When the tea is reduced and cooled a bit, strain the sprigs & leaves, add it into the pint jar and stir it well. 5. Give it a shake and use a spoonful as needed. 6. Store your homemade cough syrup in the refrigerator for about a month.  What causes a cough? In adults, common causes of a cough include: ?An infection of the airways or lungs (such as the common cold) ?Postnasal drip - Postnasal drip is when mucus from the nose drips down or flows along the back of the throat. Postnasal drip can happen when people have: .A cold .Allergies .A sinus infection - The sinuses are hollow areas in the bones of the face that open into the nose. ?Lung conditions, like asthma and chronic obstructive pulmonary disease (COPD) -  Both of these conditions can make it hard to breathe. COPD is usually caused by smoking. ?Acid reflux - Acid reflux is when the acid that is normally in your stomach backs up into your esophagus (the tube that carries food from your mouth to your stomach). ?A side effect from blood pressure medicines called "ACE inhibitors" ?Smoking cigarettes  Is there anything I can do on my own to get rid of my cough? Yes. To help get rid of your cough, you can: ?Use a humidifier in your bedroom ?Use an over-the-counter cough medicine, or suck on cough drops or hard candy ?Stop smoking, if you smoke ?If you have allergies, avoid the things you are allergic to (like pollen, dust, animals, or mold) If you have acid reflux, your doctor or nurse will tell you which lifestyle changes can help reduce symptoms.     IF you received an x-ray today, you will receive an invoice from [facility]. Please contact [facility] at [phone] with questions or concerns regarding your invoice.   IF you received labwork today, you will receive an invoice from [facility]. Please contact [facility] at [phone] with questions or concerns regarding your invoice.   Our billing staff will not be able to assist you with questions regarding bills from these companies.  You will be contacted with the lab results as soon as they are available. The fastest way to get your results is to activate  your My Chart account. Instructions are located on the last page of this paperwork. If you have not heard from [place] regarding the results in 2 weeks, please contact this office.

## 2017-06-04 ENCOUNTER — Encounter: Payer: Self-pay | Admitting: Internal Medicine

## 2017-09-19 ENCOUNTER — Emergency Department (HOSPITAL_COMMUNITY): Payer: BC Managed Care – PPO

## 2017-09-19 ENCOUNTER — Emergency Department (HOSPITAL_COMMUNITY)
Admission: EM | Admit: 2017-09-19 | Discharge: 2017-09-19 | Disposition: A | Discharge status: Home / Self Care | Payer: BC Managed Care – PPO | Attending: Emergency Medicine | Admitting: Emergency Medicine

## 2017-09-19 ENCOUNTER — Encounter (HOSPITAL_COMMUNITY): Payer: Self-pay | Admitting: Emergency Medicine

## 2017-09-19 ENCOUNTER — Other Ambulatory Visit: Payer: Self-pay

## 2017-09-19 DIAGNOSIS — I10 Essential (primary) hypertension: Secondary | ICD-10-CM | POA: Insufficient documentation

## 2017-09-19 DIAGNOSIS — Z79899 Other long term (current) drug therapy: Secondary | ICD-10-CM | POA: Diagnosis not present

## 2017-09-19 LAB — BASIC METABOLIC PANEL
Anion gap: 8 (ref 5–15)
BUN: 11 mg/dL (ref 6–20)
CO2: 27 mmol/L (ref 22–32)
Calcium: 9.2 mg/dL (ref 8.9–10.3)
Chloride: 106 mmol/L (ref 98–111)
Creatinine, Ser: 1.16 mg/dL (ref 0.61–1.24)
GFR calc Af Amer: >60 mL/min (ref >60)
GFR calc non Af Amer: >60 mL/min (ref >60)
Glucose, Bld: 133 mg/dL — ABNORMAL HIGH (ref 70–99)
Potassium: 3.7 mmol/L (ref 3.5–5.1)
Sodium: 141 mmol/L (ref 135–145)

## 2017-09-19 LAB — CBC WITH DIFFERENTIAL/PLATELET
Basophils Absolute: 0 10*3/uL (ref 0.0–0.1)
Basophils Relative: 1 %
Eosinophils Absolute: 0.1 10*3/uL (ref 0.0–0.7)
Eosinophils Relative: 2 %
HCT: 49 % (ref 39.0–52.0)
Hemoglobin: 16.6 g/dL (ref 13.0–17.0)
Lymphocytes Relative: 29 %
Lymphs Abs: 1.4 10*3/uL (ref 0.7–4.0)
MCH: 30.3 pg (ref 26.0–34.0)
MCHC: 33.9 g/dL (ref 30.0–36.0)
MCV: 89.4 fL (ref 78.0–100.0)
Monocytes Absolute: 0.6 10*3/uL (ref 0.1–1.0)
Monocytes Relative: 12 %
Neutro Abs: 2.8 10*3/uL (ref 1.7–7.7)
Neutrophils Relative %: 56 %
Platelets: 239 10*3/uL (ref 150–400)
RBC: 5.48 MIL/uL (ref 4.22–5.81)
RDW: 13.5 % (ref 11.5–15.5)
WBC: 4.9 10*3/uL (ref 4.0–10.5)

## 2017-09-19 LAB — URINALYSIS, ROUTINE W REFLEX MICROSCOPIC
Bilirubin Urine: NEGATIVE
Glucose, UA: 150 mg/dL — AB
Hgb urine dipstick: NEGATIVE
Ketones, ur: NEGATIVE mg/dL
Leukocytes, UA: NEGATIVE
Nitrite: NEGATIVE
Protein, ur: NEGATIVE mg/dL
Specific Gravity, Urine: 1.012 (ref 1.005–1.030)
pH: 6 (ref 5.0–8.0)

## 2017-09-19 LAB — RAPID URINE DRUG SCREEN, HOSP PERFORMED
Amphetamines: NOT DETECTED
Barbiturates: NOT DETECTED
Benzodiazepines: NOT DETECTED
Cocaine: NOT DETECTED
Opiates: NOT DETECTED
Tetrahydrocannabinol: NOT DETECTED

## 2017-09-19 NOTE — ED Provider Notes (Signed)
Glenn Oliver Provider Note   CSN: [id number] Arrival date & time: 09/19/17  1448     History   Chief Complaint Chief Complaint  Patient presents with  . Hypertension    HPI Glenn Oliver is a 52 y.o. male.  52 y.o male with a PMH of HTN presents to the ED with a chief complaint lightheadedness and emesis. Patient states he sat up on the side of the bed yesterday when he began to feel lightheaded, and vomited. He reports measuring his BP at home 164/100. Patient took his BP medication this morning but states his pressure has remained high.  Patient reports he first obtained his blood pressure medication at an urgent care center over a year ago, at this time no one follows him for his blood pressure management.  States last time he was seen in the urgent care his pressure was slightly elevated, he has continued the same therapy with amlodipine 10 mg daily.  He denies any leg swelling, chest pain, shortness of breath, abdominal pain or headache.     Past Medical History:  Diagnosis Date  . Hx of adenomatous colonic polyps 06/12/2014  . Hyperlipidemia    no meds   . Hypertension     Patient Active Problem List   Diagnosis Date Noted  . Left arm numbness 03/13/2017  . Cervical radiculopathy 03/13/2017  . Screening for endocrine, nutritional, metabolic and immunity disorder 03/13/2017  . Chest pain at rest 08/27/2014  . Hx of adenomatous colonic polyps 06/12/2014  . Hyperlipidemia 04/10/2010  . Hypertension 04/10/2010  . CHEST PAIN 04/10/2010  . ALLERGY 04/10/2010    Past Surgical History:  Procedure Laterality Date  . COLONOSCOPY    . ROTATOR CUFF REPAIR     right        Home Medications    Prior to Admission medications   Medication Sig Start Date End Date Taking? Authorizing Provider  amLODipine (NORVASC) 10 MG tablet Take 1 tablet (10 mg total) by mouth daily. 03/27/17  Yes Sagardia, Ines Bloomer, MD  benzonatate (TESSALON)  100 MG capsule Take 1-2 capsules (100-200 mg total) by mouth 3 (three) times daily as needed for cough. Patient not taking: Reported on 09/19/2017 05/05/17   McVey, Gelene Mink, PA-C  cyclobenzaprine (FLEXERIL) 10 MG tablet Take 1 tablet (10 mg total) by mouth at bedtime. Patient not taking: Reported on 04/20/2017 03/13/17   Horald Pollen, MD  Guaifenesin [facility] MAXIMUM STRENGTH) 1200 MG TB12 Take 1 tablet (1,200 mg total) by mouth every 12 (twelve) hours as needed. Patient not taking: Reported on 09/19/2017 05/05/17   McVey, Gelene Mink, PA-C  methylPREDNISolone (MEDROL DOSEPAK) 4 MG TBPK tablet TAKE AS DIRECTED Patient not taking: Reported on 05/05/2017 04/20/17   Garald Balding, MD  terbinafine (LAMISIL) 250 MG tablet Take 1 tablet (250 mg total) by mouth daily. Patient not taking: Reported on 03/27/2017 11/01/15   Hyatt, Max T, DPM  terbinafine (LAMISIL) 250 MG tablet Take 1 tablet (250 mg total) by mouth daily. Patient not taking: Reported on 03/27/2017 12/04/15   Garrel Ridgel, DPM    Family History Family History  Problem Relation Age of Onset  . Prostate cancer Maternal Uncle   . Hypertension Mother   . Diabetes Mother   . Heart disease Mother   . Heart disease Sister   . Diabetes Sister   . Hypertension Sister   . Cancer Sister   . Brain cancer Sister   . Colon  cancer Neg Hx   . Esophageal cancer Neg Hx   . Rectal cancer Neg Hx   . Stomach cancer Neg Hx     Social History Social History   Tobacco Use  . Smoking status: Never Smoker  . Smokeless tobacco: Never Used  Substance Use Topics  . Alcohol use: No  . Drug use: No     Allergies   Patient has no known allergies.   Review of Systems Review of Systems  Constitutional: Negative for chills and fever.  HENT: Negative for ear pain and sore throat.   Eyes: Negative for pain and visual disturbance.  Respiratory: Negative for cough and shortness of breath.   Cardiovascular: Negative for chest  pain and palpitations.  Gastrointestinal: Negative for abdominal pain and vomiting.  Genitourinary: Negative for dysuria and hematuria.  Musculoskeletal: Negative for arthralgias and back pain.  Skin: Negative for color change and rash.  Neurological: Negative for seizures and syncope.  All other systems reviewed and are negative.    Physical Exam Updated Vital Signs BP (!) 154/104 (BP Location: Left Arm)   Pulse 89   Temp 98.1 F (36.7 C) (Oral)   Resp 17   Ht 6' 4.5" (1.943 m)   Wt 132.9 kg   SpO2 95%   BMI 35.20 kg/m   Physical Exam  Constitutional: He is oriented to person, place, and time. He appears well-developed and well-nourished.  HENT:  Head: Normocephalic and atraumatic.  Mouth/Throat: Oropharynx is clear and moist.  Eyes: Pupils are equal, round, and reactive to light. No scleral icterus.  Neck: Normal range of motion.  Cardiovascular: Normal heart sounds.  Pulses:      Radial pulses are 2+ on the right side, and 2+ on the left side.  Pulmonary/Chest: Effort normal and breath sounds normal. He has no wheezes. He exhibits no tenderness.  Abdominal: Soft. Bowel sounds are normal. He exhibits no distension. There is no tenderness.  Musculoskeletal: He exhibits no tenderness or deformity.  Neurological: He is alert and oriented to person, place, and time.  Skin: Skin is warm and dry.  Nursing note and vitals reviewed.    ED Treatments / Results  Labs (all labs ordered are listed, but only abnormal results are displayed) Labs Reviewed  BASIC METABOLIC PANEL - Abnormal; Notable for the following components:      Result Value   Glucose, Bld 133 (*)    All other components within normal limits  CBC WITH DIFFERENTIAL/PLATELET  URINALYSIS, ROUTINE W REFLEX MICROSCOPIC  RAPID URINE DRUG SCREEN, HOSP PERFORMED    EKG EKG Interpretation  Date/Time:  Saturday September 19 2017 15:28:57 EDT Ventricular Rate:  80 PR Interval:    QRS Duration: 96 QT  Interval:  383 QTC Calculation: 442 R Axis:   41 Text Interpretation:  Sinus rhythm Probable left atrial enlargement Abnormal R-wave progression, early transition Borderline T wave abnormalities Minimal ST elevation, anterior leads ST-E similar to 2016 Confirmed by Merrily Pew [phone]) on 09/19/2017 6:04:09 PM   Radiology Dg Chest 2 View  Result Date: 09/19/2017 CLINICAL DATA:  Patient c/o dizziness since yesterday. Reports taking BP at home and being hypertensive. Reports taking amlodipine as prescribed. Denies chest pain, SOB, headache, or blurred vision. EXAM: CHEST - 2 VIEW COMPARISON:  08/26/2014 FINDINGS: Cardiac silhouette is normal in size. No mediastinal or hilar masses. No evidence of adenopathy. Clear lungs.  No pleural effusion or pneumothorax. Skeletal structures are intact. IMPRESSION: No active cardiopulmonary disease. Electronically Signed   By:  Lajean Manes M.D.   On: 09/19/2017 16:53    Procedures Procedures (including critical care time)  Medications Ordered in ED Medications - No data to display   Initial Impression / Assessment and Plan / ED Course  I have reviewed the triage vital signs and the nursing notes.  Pertinent labs & imaging results that were available during my care of the patient were reviewed by me and considered in my medical decision making (see chart for details).     Patient presents with HTN, patient has not been seen by a PCP in over a year. He states he first got his prescription from an UC over a year ago, and has not been followed up since.   BMP levels were within normal limits.Creatine level was within normal limits 1.16. No signs of    CBC showed no leukocytosis.Patient's blood pressure remains high while in the ED, there are no signs of kidney injury, headache.  This time patient will be discharged home to follow-up with PCP to establish care for better blood pressure management.  Return precautions provided.  Final Clinical  Impressions(s) / ED Diagnoses   Final diagnoses:  Essential hypertension    ED Discharge Orders    None       Corinna Capra 09/19/17 Loel Dubonnet, MD 09/19/17 [id number]

## 2017-09-19 NOTE — ED Notes (Signed)
Pt aware of need for urine specimen. Urinal at bedside

## 2017-09-19 NOTE — ED Triage Notes (Signed)
Patient c/o dizziness since yesterday. Reports taking BP at home and being hypertensive. Reports taking amlodipine as prescribed. Denies chest pain, SOB, headache, blurred vision.

## 2017-09-19 NOTE — ED Notes (Signed)
ED Provider at bedside. 

## 2017-09-19 NOTE — Discharge Instructions (Signed)
Please return to the ED if your symptoms worsen or you experience any chest pain or shortness of breath.

## 2018-06-03 ENCOUNTER — Other Ambulatory Visit: Payer: Self-pay | Admitting: Emergency Medicine

## 2018-06-03 DIAGNOSIS — I10 Essential (primary) hypertension: Secondary | ICD-10-CM

## 2018-06-03 NOTE — Telephone Encounter (Signed)
LOV 05/05/17. TC to patient.Left VM to call and schedule an OV before we can refill medication.

## 2020-05-08 NOTE — Progress Notes (Signed)
Patient referred by Lin Landsman, MD for hypertension  Subjective:   Glenn Oliver, male    DOB: 06/10/1965, 55 y.o.   MRN: [id number]   Chief Complaint  Patient presents with  . Hypertension  . Coronary Artery Disease     HPI  55 y.o. African American male with hypertension, type 2 DM, mixed hyperlipidemia, OSA on CPAP.  Patient syndrome, works as a Designer, industrial/product.  He is to be in [place] in his younger days, play football.  Currently, his physical activity is limited to walking at work, but is trying to increase exercise and walking outside of work as well.  He has been on amlodipine 10 mg and hydrochlorothiazide 25 mg for at least couple of years.  However, he continues to have uncontrolled blood pressure around 150/100 mmHg.  He is also on Metformin for type 2 diabetes for last 3 years.  He admits to eating outside food couple days a week.  He does not add any salt when he cooks at home.  He has been using CPAP for a few years, but has had issues with fitting of the mask, he also feels that he hyperventilates when he wears the mask.  He is non-smoker, drinks alcohol only occasionally.  Used to drink sodas, but has cut down now.   Past Medical History:  Diagnosis Date  . Hx of adenomatous colonic polyps 06/12/2014  . Hyperlipidemia    no meds   . Hypertension      Past Surgical History:  Procedure Laterality Date  . COLONOSCOPY    . ROTATOR CUFF REPAIR     right     Social History   Tobacco Use  Smoking Status Never Smoker  Smokeless Tobacco Never Used    Social History   Substance and Sexual Activity  Alcohol Use No     Family History  Problem Relation Age of Onset  . Prostate cancer Maternal Uncle   . Hypertension Mother   . Diabetes Mother   . Heart disease Mother   . Heart disease Sister   . Diabetes Sister   . Hypertension Sister   . Cancer Sister   . Brain cancer Sister   . Colon cancer Neg Hx   . Esophageal cancer Neg Hx   .  Rectal cancer Neg Hx   . Stomach cancer Neg Hx      Current Outpatient Medications on File Prior to Visit  Medication Sig Dispense Refill  . amLODipine (NORVASC) 10 MG tablet Take 1 tablet (10 mg total) by mouth daily. 90 tablet 3  . hydrochlorothiazide (HYDRODIURIL) 25 MG tablet Take 25 mg by mouth daily.    . metFORMIN (GLUCOPHAGE) 500 MG tablet Take 500 mg by mouth 2 (two) times daily.     No current facility-administered medications on file prior to visit.    Cardiovascular and other pertinent studies:  EKG 05/09/2020: Sinus rhythm 88 bpm  Diffuse nonspecific T-abnormality   Recent labs: 04/27/2020: Glucose 330, BUN/Cr 12/1.16. EGFR 74. Na/K 136/4.4. Rest of the CMP normal H/H 14/45. MCV 89. Platelets 216  2018: HbA1C 6.3% Chol 166, TG 64, HDL 35, LDL 118 TSH 1.6 normal    Review of Systems  Cardiovascular: Negative for chest pain, dyspnea on exertion, leg swelling, palpitations and syncope.         Vitals:   05/09/20 0817 05/09/20 0828  BP: (!) 152/114 (!) 153/103  Pulse: 88 92  Temp: (!) 94 F (34.4 C)   SpO2:  96% 96%     Body mass index is 34.84 kg/m. Filed Weights   05/09/20 0817  Weight: 290 lb (131.5 kg)     Objective:   Physical Exam Vitals and nursing note reviewed.  Constitutional:      General: He is not in acute distress. Neck:     Vascular: No JVD.  Cardiovascular:     Rate and Rhythm: Normal rate and regular rhythm.     Heart sounds: Normal heart sounds. No murmur heard.   Pulmonary:     Effort: Pulmonary effort is normal.     Breath sounds: Normal breath sounds. No wheezing or rales.         Assessment & Recommendations:   55 y.o. African American male with hypertension, type 2 DM, mixed hyperlipidemia, OSA on CPAP.  Primary hypertension: Uncontrolled.  Currently on amlodipine 10 mg and hydrochlorothiazide 25 mg daily. Given his diabetes, added losartan 50 mg daily.  Check BMP in morning. Check  echocardiogram. Counseled regarding increasing physical activity and reducing outside fluid intake.  Mixed hyperlipidemia: LDL 118 and diabetic patient.  Added Crestor 10 mg daily.  OSA on CPAP: Has had issues with fitting of the mask recently.  Has not had sleep study in several years.  Referred back for sleep study and possibly getting a new mask.  Further recommendations as above testing.  Thank you for referring the patient to [place]. Please feel free to contact with any questions.   Nigel Mormon, MD Pager: [phone] Office: [phone]

## 2020-05-09 ENCOUNTER — Other Ambulatory Visit: Payer: Self-pay

## 2020-05-09 ENCOUNTER — Encounter: Payer: Self-pay | Admitting: Cardiology

## 2020-05-09 ENCOUNTER — Ambulatory Visit: Payer: BC Managed Care – PPO | Admitting: Cardiology

## 2020-05-09 ENCOUNTER — Other Ambulatory Visit (HOSPITAL_COMMUNITY): Payer: Self-pay | Admitting: Cardiology

## 2020-05-09 VITALS — BP 153/103 | HR 92 | Temp 94.0°F | Ht 76.5 in | Wt 290.0 lb

## 2020-05-09 DIAGNOSIS — I10 Essential (primary) hypertension: Secondary | ICD-10-CM

## 2020-05-09 DIAGNOSIS — G4733 Obstructive sleep apnea (adult) (pediatric): Secondary | ICD-10-CM

## 2020-05-09 DIAGNOSIS — E782 Mixed hyperlipidemia: Secondary | ICD-10-CM

## 2020-05-09 HISTORY — DX: Obstructive sleep apnea (adult) (pediatric): G47.33

## 2020-05-09 MED ORDER — LOSARTAN POTASSIUM 50 MG PO TABS: 50.0000 mg | ORAL_TABLET | Freq: Every day | ORAL | 3 refills | Status: AC

## 2020-05-09 MED ORDER — ROSUVASTATIN CALCIUM 10 MG PO TABS: 10.0000 mg | ORAL_TABLET | Freq: Every day | ORAL | 3 refills | Status: AC

## 2020-05-10 LAB — BASIC METABOLIC PANEL
BUN/Creatinine Ratio: 11 (ref 9–20)
BUN: 15 mg/dL (ref 6–24)
CO2: 25 mmol/L (ref 20–29)
Calcium: 9.7 mg/dL (ref 8.7–10.2)
Chloride: 97 mmol/L (ref 96–106)
Creatinine, Ser: 1.33 mg/dL — ABNORMAL HIGH (ref 0.76–1.27)
Glucose: 256 mg/dL — ABNORMAL HIGH (ref 65–99)
Potassium: 3.8 mmol/L (ref 3.5–5.2)
Sodium: 140 mmol/L (ref 134–144)
eGFR: 63 mL/min/{1.73_m2} (ref >59)

## 2020-05-21 ENCOUNTER — Ambulatory Visit: Payer: BC Managed Care – PPO

## 2020-05-21 ENCOUNTER — Other Ambulatory Visit: Payer: Self-pay

## 2020-05-21 DIAGNOSIS — I10 Essential (primary) hypertension: Secondary | ICD-10-CM

## 2020-05-28 NOTE — Progress Notes (Signed)
Attempted to call pt, number is not in service. Will try to call sister later.

## 2020-05-31 NOTE — Progress Notes (Signed)
Called and spoke with pt regarding echo results. Pt voiced understanding.

## 2020-06-27 ENCOUNTER — Encounter: Payer: Self-pay | Admitting: Neurology

## 2020-06-28 ENCOUNTER — Encounter: Payer: Self-pay | Admitting: Neurology

## 2020-06-28 ENCOUNTER — Ambulatory Visit: Payer: BC Managed Care – PPO | Admitting: Neurology

## 2020-06-28 VITALS — BP 146/92 | HR 80 | Ht 77.0 in | Wt 290.3 lb

## 2020-06-28 DIAGNOSIS — R030 Elevated blood-pressure reading, without diagnosis of hypertension: Secondary | ICD-10-CM

## 2020-06-28 DIAGNOSIS — G4733 Obstructive sleep apnea (adult) (pediatric): Secondary | ICD-10-CM | POA: Diagnosis not present

## 2020-06-28 DIAGNOSIS — Z789 Other specified health status: Secondary | ICD-10-CM | POA: Diagnosis not present

## 2020-06-28 DIAGNOSIS — E669 Obesity, unspecified: Secondary | ICD-10-CM

## 2020-06-28 NOTE — Patient Instructions (Signed)
It was nice to meet you today.  We will try to get 2 new supplies.  I would like for you to try to get back on track with your CPAP machine, it may not be quite yet eligible for replacement as you are set up date appears to be July 2018.  By July next year you may be eligible for new equipment and we could proceed with reevaluation with a sleep study or home sleep test at the time to qualify you for an updated machine.  Please continue using your CPAP regularly. While your insurance requires that you use CPAP at least 4 hours each night on 70% of the nights, I recommend, that you not skip any nights and use it throughout the night if you can. Getting used to CPAP and staying with the treatment long term does take time and patience and discipline. Untreated obstructive sleep apnea when it is moderate to severe can have an adverse impact on cardiovascular health and raise her risk for heart disease, arrhythmias, hypertension, congestive heart failure, stroke and diabetes. Untreated obstructive sleep apnea causes sleep disruption, nonrestorative sleep, and sleep deprivation. This can have an impact on your day to day functioning and cause daytime sleepiness and impairment of cognitive function, memory loss, mood disturbance, and problems focussing. Using CPAP regularly can improve these symptoms.  We will try to get records of your previous sleep study or sleep studies.  If you find any records at home, please stop by the office to leave a copy if you do not mind.  You may also be able to scan in a copy through [employer].

## 2020-06-28 NOTE — Progress Notes (Signed)
Subjective:    Patient ID: KINGSLEE MAIRENA is a 55 y.o. male.  HPI     Star Age, MD, PhD [facility] [address] [place], [place] [address]   Dear Dr. Virgina Jock,   I saw your patient, Teng Decou, upon your kind request in my sleep clinic today for initial consultation of his sleep disorder, in particular, evaluation of his prior diagnosis of obstructive sleep apnea.  The patient is unaccompanied today.  As you know, Mr. Lomeli is a 55 year old right-handed gentleman with an underlying medical history of hypertension with elevated blood pressure readings, hyperlipidemia, history of chest pain, and obesity, who was previously diagnosed with obstructive sleep apnea and placed on CPAP therapy.  He believes his sleep apnea diagnosis was about 5 years ago.  He remembers going to [place], [place] for testing.  I looked in his chart, and outside records, there is mentioning of a sleep study on 07/14/2016 but I cannot retrieve the results.  This was under the care of of Dr. Rip Harbour Khurshid.  Prior to that he had a sleep study in or around 2015, this was done in [place].  At that time he was not placed on CPAP therapy.  He has not been able to use his CPAP regularly as he feels that the mask is not fitting right.  Sometimes he feels smothered by the air.  We were able to retrieve some data from his machine, he has an air sense 10 from [employer].  Set up date through the online records indicate a set up on 09/03/2016.  His pressure is 10 cm with EPR of 3.  In the past 90 days between 03/30/2020 through 06/27/2020 he used his machine 53 days old lady with average usage of 1 hour and 25 minutes only.  Residual AHI for those 8 days was 1.6/h, leak in the acceptable mid range with the 95th percentile of leakage at 14.9 L/min.  He reports never actually being able to be fully compliant with treatment.  His DME company is [employer] [place].  He used  to work in the [place] area.  He now works in [employer] as a Designer, industrial/product.  He is divorced, he lives alone, he has a grown son.  Bedtime generally is around 1030, rise time around 530.  He works Mondays through Fridays from 6 AM to 5 PM.  He has no pets in the household.  He is a non-smoker and drinks alcohol maybe once a month, no day-to-day caffeine.  He denies night to night nocturia but has occasionally woken up with a headache.  He is currently using a nasal mask and tried a full facemask before.  He has a niece with sleep apnea.  His weight has been more or less stable, typically fluctuating within 5 pounds.  His Past Medical History Is Significant For: Past Medical History:  Diagnosis Date  . Hx of adenomatous colonic polyps 06/12/2014  . Hyperlipidemia    no meds   . Hypertension   . OSA on CPAP 05/09/2020    His Past Surgical History Is Significant For: Past Surgical History:  Procedure Laterality Date  . COLONOSCOPY    . KNEE SURGERY Right 1989  . ROTATOR CUFF REPAIR     right    His Family History Is Significant For: Family History  Problem Relation Age of Onset  . Prostate cancer Maternal Uncle   . Hypertension Mother   . Diabetes Mother   .  Heart disease Mother   . Heart disease Sister   . Diabetes Sister   . Hypertension Sister   . Cancer Sister   . Brain cancer Sister   . Colon cancer Neg Hx   . Esophageal cancer Neg Hx   . Rectal cancer Neg Hx   . Stomach cancer Neg Hx     His Social History Is Significant For: Social History   Socioeconomic History  . Marital status: Legally Separated    Spouse name: Not on file  . Number of children: 1  . Years of education: Not on file  . Highest education level: Not on file  Occupational History  . Not on file  Tobacco Use  . Smoking status: Never Smoker  . Smokeless tobacco: Never Used  Vaping Use  . Vaping Use: Never used  Substance and Sexual Activity  . Alcohol use: No  . Drug use: No  .  Sexual activity: Not on file  Other Topics Concern  . Not on file  Social History Narrative  . Not on file   Social Determinants of Health   Financial Resource Strain: Not on file  Food Insecurity: Not on file  Transportation Needs: Not on file  Physical Activity: Not on file  Stress: Not on file  Social Connections: Not on file    His Allergies Are:  No Known Allergies:   His Current Medications Are:  Outpatient Encounter Medications as of 06/28/2020  Medication Sig  . amLODipine (NORVASC) 10 MG tablet Take 1 tablet (10 mg total) by mouth daily.  . hydrochlorothiazide (HYDRODIURIL) 25 MG tablet Take 25 mg by mouth daily.  Marland Kitchen losartan (COZAAR) 50 MG tablet Take 1 tablet (50 mg total) by mouth daily.  . metFORMIN (GLUCOPHAGE) 500 MG tablet Take 500 mg by mouth 2 (two) times daily.  . rosuvastatin (CRESTOR) 10 MG tablet Take 1 tablet (10 mg total) by mouth daily.   No facility-administered encounter medications on file as of 06/28/2020.  :   Review of Systems:  Out of a complete 14 point review of systems, all are reviewed and negative with the exception of these symptoms as listed below: Review of Systems  Neurological:       Here for sleep consult. Prior sleep study 5+ years ago received current CPAP at that time. Pt reports being on one cpap so far. Reports current machine is working ok. Pt does report trouble keeping his mask on through out the night.  Epworth Sleepiness Scale 0= would never doze 1= slight chance of dozing 2= moderate chance of dozing 3= high chance of dozing  Sitting and reading:2 Watching TV:3 Sitting inactive in a public place (ex. Theater or meeting):0 As a passenger in a car for an hour without a break:2 Lying down to rest in the afternoon:2 Sitting and talking to someone:0 Sitting quietly after lunch (no alcohol):2 In a car, while stopped in traffic:0 Total:11     Objective:  Neurological Exam  Physical Exam Physical Examination:    Vitals:   06/28/20 1124  BP: (!) 146/92  Pulse: 80  SpO2: 96%    General Examination: The patient is a very pleasant 55 y.o. male in no acute distress. He appears well-developed and well-nourished and well groomed.   HEENT: Normocephalic, atraumatic, pupils are equal, round and reactive to light, extraocular tracking is good without limitation to gaze excursion or nystagmus noted. Hearing is grossly intact. Face is symmetric with normal facial animation. Speech is clear with no dysarthria  noted. There is no hypophonia. There is no lip, neck/head, jaw or voice tremor. Neck is supple with full range of passive and active motion. There are no carotid bruits on auscultation. Oropharynx exam reveals: mild mouth dryness, good dental hygiene and moderate airway crowding, due to tonsillar size of 1+, prominent uvula, larger tongue.  Mallampati class II.  Neck circumference of 17 three-quarter inches.  Tongue protrudes centrally and palate elevates symmetrically.  Chest: Clear to auscultation without wheezing, rhonchi or crackles noted.  Heart: S1+S2+0, regular and normal without murmurs, rubs or gallops noted.   Abdomen: Soft, non-tender and non-distended with normal bowel sounds appreciated on auscultation.  Extremities: There is no pitting edema in the distal lower extremities bilaterally.   Skin: Warm and dry without trophic changes noted.   Musculoskeletal: exam reveals no obvious joint deformities, tenderness or joint swelling or erythema.   Neurologically:  Mental status: The patient is awake, alert and oriented in all 4 spheres. His immediate and remote memory, attention, language skills and fund of knowledge are appropriate. There is no evidence of aphasia, agnosia, apraxia or anomia. Speech is clear with normal prosody and enunciation. Thought process is linear. Mood is normal and affect is normal.  Cranial nerves II - XII are as described above under HEENT exam.  Motor exam: Normal  bulk, strength and tone is noted. There is no tremor, Romberg is negative. Fine motor skills and coordination: grossly intact.  Cerebellar testing: No dysmetria or intention tremor. There is no truncal or gait ataxia.  Sensory exam: intact to light touch in the upper and lower extremities.  Gait, station and balance: He stands easily. No veering to one side is noted. No leaning to one side is noted. Posture is age-appropriate and stance is narrow based. Gait shows normal stride length and normal pace. No problems turning are noted. Tandem walk is unremarkable.                Assessment and Plan:  In summary, MURIEL WILBER is a very pleasant 55 y.o.-year old male with an underlying medical history of hypertension with elevated blood pressure readings, hyperlipidemia, history of chest pain, and obesity, who presents for evaluation of his obstructive sleep apnea.  He was diagnosed with OSA in or around June 2018 when he was still residing in the [place], [place] area and working there.  He has not been able to use his CPAP consistently but would be willing to try to get back on track.  He would like to get a different style of mask.  I had a long chat with the patient about my findings and the diagnosis of OSA, its prognosis and treatment options. I explained in particular the risks and ramifications of untreated moderate to severe OSA, especially with respect to developing cardiovascular disease down the Road, including congestive heart failure, difficult to treat hypertension, cardiac arrhythmias, or stroke. Even type 2 diabetes has, in part, been linked to untreated OSA. Symptoms of untreated OSA include daytime sleepiness, memory problems, mood irritability and mood disorder such as depression and anxiety, lack of energy, as well as recurrent headaches, especially morning headaches. We talked the importance of trying to maintain a healthy lifestyle in general, as well as the importance of weight  control. We also talked about the importance of good sleep hygiene. I recommended I write for new supplies and that we also requested a mask refit appointment through his DME company.  He is motivated to get back on  track with his CPAP machine.  He is not quite eligible for a new machine until about summer next year from what I can tell.  At that time we can consider repeating a sleep evaluation test with a in lab study versus a home sleep test to qualify him for new equipment.  He is agreeable to this plan.  He is advised to be consistent with his CPAP.  He is encouraged that when he uses his CPAP his apnea and hypopnea score is adequate, indicating well treated sleep apnea.  He is advised to follow-up routinely in this clinic in 3 months, sooner if needed.  I answered all his questions today and he was in agreement.   Thank you very much for allowing me to participate in the care of this nice patient. If I can be of any further assistance to you please do not hesitate to call me at [phone].  Sincerely,   Star Age, MD, PhD

## 2020-10-03 ENCOUNTER — Ambulatory Visit: Payer: BC Managed Care – PPO | Admitting: Neurology

## 2020-10-03 ENCOUNTER — Encounter: Payer: Self-pay | Admitting: Neurology
